# Patient Record
Sex: Male | Born: 1960
Health system: Southern US, Community
[De-identification: ages and names within clinical notes are randomized; demographics above are authoritative.]

## PROBLEM LIST (undated history)

## (undated) DIAGNOSIS — R002 Palpitations: Secondary | ICD-10-CM

## (undated) DIAGNOSIS — C8190 Hodgkin lymphoma, unspecified, unspecified site: Secondary | ICD-10-CM

## (undated) DIAGNOSIS — E785 Hyperlipidemia, unspecified: Secondary | ICD-10-CM

## (undated) DIAGNOSIS — J309 Allergic rhinitis, unspecified: Secondary | ICD-10-CM

## (undated) DIAGNOSIS — I10 Essential (primary) hypertension: Secondary | ICD-10-CM

## (undated) DIAGNOSIS — E039 Hypothyroidism, unspecified: Secondary | ICD-10-CM

## (undated) HISTORY — DX: Palpitations: R00.2

## (undated) HISTORY — DX: Hypothyroidism, unspecified: E03.9

## (undated) HISTORY — PX: SPLENECTOMY: SUR1306

## (undated) HISTORY — DX: Hodgkin lymphoma, unspecified, unspecified site: C81.90

## (undated) HISTORY — DX: Allergic rhinitis, unspecified: J30.9

---

## 1981-03-23 DIAGNOSIS — C8190 Hodgkin lymphoma, unspecified, unspecified site: Secondary | ICD-10-CM

## 1981-03-23 HISTORY — DX: Hodgkin lymphoma, unspecified, unspecified site: C81.90

## 2002-11-19 ENCOUNTER — Encounter: Payer: Self-pay | Admitting: Emergency Medicine

## 2002-11-19 ENCOUNTER — Emergency Department (HOSPITAL_COMMUNITY): Admission: AD | Admit: 2002-11-19 | Discharge: 2002-11-19 | Payer: Self-pay | Admitting: Emergency Medicine

## 2002-11-20 ENCOUNTER — Observation Stay (HOSPITAL_COMMUNITY): Admission: EM | Admit: 2002-11-20 | Discharge: 2002-11-20 | Payer: Self-pay | Admitting: Emergency Medicine

## 2002-12-13 ENCOUNTER — Ambulatory Visit (HOSPITAL_COMMUNITY): Admission: RE | Admit: 2002-12-13 | Discharge: 2002-12-13 | Payer: Self-pay | Admitting: Internal Medicine

## 2002-12-15 ENCOUNTER — Ambulatory Visit (HOSPITAL_COMMUNITY): Admission: RE | Admit: 2002-12-15 | Discharge: 2002-12-15 | Payer: Self-pay | Admitting: Internal Medicine

## 2002-12-15 ENCOUNTER — Encounter: Payer: Self-pay | Admitting: Internal Medicine

## 2005-11-19 ENCOUNTER — Ambulatory Visit (HOSPITAL_COMMUNITY): Admission: RE | Admit: 2005-11-19 | Discharge: 2005-11-19 | Payer: Self-pay | Admitting: Internal Medicine

## 2010-04-13 ENCOUNTER — Encounter: Payer: Self-pay | Admitting: Internal Medicine

## 2010-08-13 ENCOUNTER — Other Ambulatory Visit: Payer: Self-pay | Admitting: Internal Medicine

## 2010-08-13 DIAGNOSIS — E291 Testicular hypofunction: Secondary | ICD-10-CM

## 2010-08-19 ENCOUNTER — Ambulatory Visit
Admission: RE | Admit: 2010-08-19 | Discharge: 2010-08-19 | Disposition: A | Discharge status: Home / Self Care | Payer: BC Managed Care – PPO | Source: Ambulatory Visit | Attending: Internal Medicine | Admitting: Internal Medicine

## 2010-08-19 DIAGNOSIS — E291 Testicular hypofunction: Secondary | ICD-10-CM

## 2010-08-20 ENCOUNTER — Ambulatory Visit (HOSPITAL_COMMUNITY)
Admission: RE | Admit: 2010-08-20 | Discharge: 2010-08-20 | Disposition: A | Discharge status: Home / Self Care | Payer: BC Managed Care – PPO | Source: Ambulatory Visit | Attending: Internal Medicine | Admitting: Internal Medicine

## 2010-08-20 DIAGNOSIS — I359 Nonrheumatic aortic valve disorder, unspecified: Secondary | ICD-10-CM

## 2010-08-20 DIAGNOSIS — I080 Rheumatic disorders of both mitral and aortic valves: Secondary | ICD-10-CM | POA: Insufficient documentation

## 2010-08-20 DIAGNOSIS — I379 Nonrheumatic pulmonary valve disorder, unspecified: Secondary | ICD-10-CM | POA: Insufficient documentation

## 2010-08-20 DIAGNOSIS — I10 Essential (primary) hypertension: Secondary | ICD-10-CM | POA: Insufficient documentation

## 2012-10-09 ENCOUNTER — Ambulatory Visit (HOSPITAL_BASED_OUTPATIENT_CLINIC_OR_DEPARTMENT_OTHER): Payer: BC Managed Care – PPO | Attending: Internal Medicine | Admitting: Radiology

## 2012-10-09 VITALS — Ht 75.0 in | Wt 228.0 lb

## 2012-10-09 DIAGNOSIS — G4733 Obstructive sleep apnea (adult) (pediatric): Secondary | ICD-10-CM | POA: Insufficient documentation

## 2012-10-15 DIAGNOSIS — R0609 Other forms of dyspnea: Secondary | ICD-10-CM

## 2012-10-15 DIAGNOSIS — R0989 Other specified symptoms and signs involving the circulatory and respiratory systems: Secondary | ICD-10-CM

## 2012-10-15 DIAGNOSIS — G4733 Obstructive sleep apnea (adult) (pediatric): Secondary | ICD-10-CM

## 2012-10-15 NOTE — Procedures (Signed)
NAME:  John Clay, John Clay NO.:  [id number]  MEDICAL RECORD NO.:  [id number]          PATIENT TYPE:  OUT  LOCATION:  SLEEP CENTER                 FACILITY:  [facility]  PHYSICIAN:  Asianna Brundage D. Maple Hudson, MD, FCCP, FACPDATE OF BIRTH:  [birth year] 02, 1960  DATE OF STUDY:  10/09/2012                           NOCTURNAL POLYSOMNOGRAM  REFERRING PHYSICIAN:  Margaretmary Bayley, M.D.  INDICATION FOR STUDY:  Hypersomnia with sleep apnea.  EPWORTH SLEEPINESS SCORE:  11/24.  BMI 28.5, weight 228 pounds, height 75 inches, neck 14 inches.  MEDICATIONS:  Home medications are charted for review.  SLEEP ARCHITECTURE:  Total sleep time 338.5 minutes with sleep efficiency 86.9%.  Stage I was 4.9%, stage II 74.2%.  Stage III absent. REM 21% of total sleep time.  Sleep latency 7.5 minutes.  REM latency 73 minutes.  Awake after sleep onset 42.5 minutes.  Arousal index 7.1. Bedtime medication:  None.  RESPIRATORY DATA:  Apnea-hypopnea index (AHI) 12.2 per hour.  A total of 69 events was scored including 31 obstructive apneas, 4 central apneas, 34 hypopneas.  Events were more common while supine and while in REM. REM AHI 13.5 per hour.  This is a diagnostic NPSG protocol as ordered without CPAP.  OXYGEN DATA:  Moderately loud snoring with oxygen desaturation to a nadir of 85% and mean oxygen saturation through the study of 94.5% on room air.  CARDIAC DATA:  Sinus rhythm with frequent PVCs and occasional PAC.  MOVEMENT-PARASOMNIA:  No significant movement disturbance.  Bathroom x1.  IMPRESSIONS-RECOMMENDATIONS: 1. Mild obstructive sleep apnea/hypopnea syndrome, AHI  12.2 per hour.     Events were more common while supine and while in REM.  REM AHI     13.5 per hour.  Moderately loud snoring with oxygen desaturation to     a nadir of 85% and mean oxygen saturation through the study of     94.5% on room air. 2. This study was ordered as a diagnostic NPSG protocol without CPAP.     The patient can return  for dedicated CPAP titration study if     appropriate.    [name], MD, [facility], FACP Diplomate, American Board of Sleep Medicine   CDY/MEDQ  D:  10/15/2012 13:46:34  T:  10/15/2012 14:32:04  Job:  [id number]

## 2012-10-20 ENCOUNTER — Institutional Professional Consult (permissible substitution): Payer: BC Managed Care – PPO | Admitting: Pulmonary Disease

## 2012-12-15 ENCOUNTER — Ambulatory Visit (INDEPENDENT_AMBULATORY_CARE_PROVIDER_SITE_OTHER): Payer: BC Managed Care – PPO | Admitting: Pulmonary Disease

## 2012-12-15 ENCOUNTER — Encounter: Payer: Self-pay | Admitting: Pulmonary Disease

## 2012-12-15 VITALS — BP 130/80 | HR 94 | Temp 97.9°F | Ht 76.0 in | Wt 234.0 lb

## 2012-12-15 DIAGNOSIS — G4733 Obstructive sleep apnea (adult) (pediatric): Secondary | ICD-10-CM | POA: Insufficient documentation

## 2012-12-15 NOTE — Patient Instructions (Signed)
Will arrange for CPAP set up at home Follow up in 2 months 

## 2012-12-15 NOTE — Assessment & Plan Note (Signed)
He has snoring, sleep disruption, witnessed apnea, and daytime sleepiness.  His sleep study shows mild obstructive sleep apnea.  I have reviewed the recent sleep study results with the patient.  We discussed how sleep apnea can affect various health problems including risks for hypertension, cardiovascular disease, and diabetes.  We also discussed how sleep disruption can increase risks for accident, such as while driving.  Weight loss as a means of improving sleep apnea was also reviewed.  Additional treatment options discussed were CPAP therapy, oral appliance, and surgical intervention.  Will arrange for auto CPAP set up.

## 2012-12-15 NOTE — Progress Notes (Deleted)
  Subjective:    Patient ID: John Clay, male    DOB: 1960-09-08, 52 y.o.   MRN: [id number]  HPI    Review of Systems  Constitutional: Positive for fatigue. Negative for fever, chills, diaphoresis, activity change, appetite change and unexpected weight change.  HENT: Negative for hearing loss, ear pain, nosebleeds, congestion, sore throat, facial swelling, rhinorrhea, sneezing, mouth sores, trouble swallowing, neck pain, neck stiffness, dental problem, voice change, postnasal drip, sinus pressure, tinnitus and ear discharge.   Eyes: Negative for photophobia, discharge, itching and visual disturbance.  Respiratory: Negative for apnea, cough, choking, chest tightness, shortness of breath, wheezing and stridor.   Cardiovascular: Negative for chest pain, palpitations and leg swelling.  Gastrointestinal: Negative for nausea, vomiting, abdominal pain, constipation, blood in stool and abdominal distention.  Genitourinary: Negative for dysuria, urgency, frequency, hematuria, flank pain, decreased urine volume and difficulty urinating.  Musculoskeletal: Positive for myalgias. Negative for back pain, joint swelling, arthralgias and gait problem.  Skin: Positive for rash. Negative for color change and pallor.  Neurological: Negative for dizziness, tremors, seizures, syncope, speech difficulty, weakness, light-headedness, numbness and headaches.  Hematological: Negative for adenopathy. Does not bruise/bleed easily.  Psychiatric/Behavioral: Positive for sleep disturbance. Negative for confusion and agitation. The patient is nervous/anxious.        Objective:   Physical Exam        Assessment & Plan:

## 2012-12-15 NOTE — Progress Notes (Signed)
Chief Complaint  Patient presents with  . Sleep Consult    referred by Dr. Chestine Spore for OSA. Epworth Score: 9.    History of Present Illness: John Clay is a 52 y.o. male for evaluation of sleep problems.  He has noticed trouble with feeling fatigued and sleepy during the afternoon.  His family has been concerned about his snoring, and has told him he stops breathing while asleep.  As a result he had sleep study in July 2014 which showed mild sleep apnea.  He was referred to pulmonary/sleep medicine for further assessment.  He goes to sleep between 10 and midnight.  He falls asleep quickly.  He wakes up 1 time to use the bathroom.  He gets out of bed at 630 am.  He feels okay in the morning.  He denies morning headache.  He does not use anything to help him fall sleep or stay awake.  He denies sleep walking, sleep talking, bruxism, or nightmares.  There is no history of restless legs.  He denies sleep hallucinations, sleep paralysis, or cataplexy.  The Epworth score is 9 out of 24.  Tests: PSG 10/09/12 >> AHI 12.2, SpO2 low 85%  John Clay  has a past medical history of Hypothyroidism; Heart palpitations; Hodgkin's lymphoma (1983); and Allergic rhinitis.  John Clay  has past surgical history that includes Splenectomy.  Prior to Admission medications   Medication Sig Start Date End Date Taking? Authorizing Provider  AXIRON 30 MG/ACT SOLN Apply 1 Squirt topically daily. 11/07/12   Historical Provider, MD  metoprolol succinate (TOPROL-XL) 25 MG 24 hr tablet Take 1 tablet by mouth daily. 12/07/12   Historical Provider, MD  SYNTHROID 88 MCG tablet Take 1 tablet by mouth daily. 12/01/12   Historical Provider, MD    No Known Allergies  His family history includes Hypertension in his father; Kidney disease in his father.  He  reports that he has never smoked. He has never used smokeless tobacco. He reports that  drinks alcohol. He reports that he does not use illicit  drugs.  Review of Systems  Constitutional: Positive for fatigue. Negative for fever, chills, diaphoresis, activity change, appetite change and unexpected weight change.  HENT: Negative for hearing loss, ear pain, nosebleeds, congestion, sore throat, facial swelling, rhinorrhea, sneezing, mouth sores, trouble swallowing, neck pain, neck stiffness, dental problem, voice change, postnasal drip, sinus pressure, tinnitus and ear discharge.   Eyes: Negative for photophobia, discharge, itching and visual disturbance.  Respiratory: Negative for apnea, cough, choking, chest tightness, shortness of breath, wheezing and stridor.   Cardiovascular: Negative for chest pain, palpitations and leg swelling.  Gastrointestinal: Negative for nausea, vomiting, abdominal pain, constipation, blood in stool and abdominal distention.  Genitourinary: Negative for dysuria, urgency, frequency, hematuria, flank pain, decreased urine volume and difficulty urinating.  Musculoskeletal: Positive for myalgias. Negative for back pain, joint swelling, arthralgias and gait problem.  Skin: Positive for rash. Negative for color change and pallor.  Neurological: Negative for dizziness, tremors, seizures, syncope, speech difficulty, weakness, light-headedness, numbness and headaches.  Hematological: Negative for adenopathy. Does not bruise/bleed easily.  Psychiatric/Behavioral: Positive for sleep disturbance. Negative for confusion and agitation. The patient is nervous/anxious.    Physical Exam:  General - No distress ENT - No sinus tenderness, no oral exudate, no LAN, no thyromegaly, TM clear, pupils equal/reactive, MP 3, scalloped tongue, 3+ tonsils, elongated uvula Cardiac - s1s2 regular, no murmur, pulses symmetric Chest - No wheeze/rales/dullness, good air entry, normal respiratory excursion  Back - No focal tenderness Abd - Soft, non-tender, no organomegaly, + bowel sounds Ext - No edema Neuro - Normal strength, cranial nerves  intact Skin - No rashes Psych - Normal mood, and behavior

## 2013-02-07 ENCOUNTER — Encounter: Payer: Self-pay | Admitting: Pulmonary Disease

## 2013-02-14 ENCOUNTER — Encounter (INDEPENDENT_AMBULATORY_CARE_PROVIDER_SITE_OTHER): Payer: Self-pay

## 2013-02-14 ENCOUNTER — Ambulatory Visit (INDEPENDENT_AMBULATORY_CARE_PROVIDER_SITE_OTHER): Payer: BC Managed Care – PPO | Admitting: Pulmonary Disease

## 2013-02-14 ENCOUNTER — Encounter: Payer: Self-pay | Admitting: Pulmonary Disease

## 2013-02-14 VITALS — BP 122/82 | HR 90 | Temp 97.9°F | Ht 75.0 in | Wt 236.0 lb

## 2013-02-14 DIAGNOSIS — G4733 Obstructive sleep apnea (adult) (pediatric): Secondary | ICD-10-CM

## 2013-02-14 NOTE — Patient Instructions (Signed)
Will call with results of CPAP report  Follow up in 1 year 

## 2013-02-14 NOTE — Progress Notes (Signed)
Chief Complaint  Patient presents with  . Sleep Apnea    Currently using CPAP every night for 6 hours. Feels well rested after use. Denies problems with machine, mask or pressure.    History of Present Illness: John Clay is a 52 y.o. male with mild OSA using auto CPAP.  He has been doing well with CPAP.  He is sleeping better, and feels more rested.  He has full face mask, and no issues with mask or pressure settings.  TESTS: PSG 10/09/12 >> AHI 12.2, SpO2 low 85%   John Clay  has a past medical history of Hypothyroidism; Heart palpitations; Hodgkin's lymphoma (1983); and Allergic rhinitis.  John Clay  has past surgical history that includes Splenectomy.  Prior to Admission medications   Medication Sig Start Date End Date Taking? Authorizing Provider  AXIRON 30 MG/ACT SOLN Apply 1 Squirt topically daily. 11/07/12  Yes Historical Provider, MD  metoprolol succinate (TOPROL-XL) 25 MG 24 hr tablet Take 1 tablet by mouth daily. 12/07/12  Yes Historical Provider, MD  SYNTHROID 88 MCG tablet Take 1 tablet by mouth daily. 12/01/12  Yes Historical Provider, MD    No Known Allergies   Physical Exam:  General - No distress ENT - No sinus tenderness, no oral exudate, no LAN, MP 3, scalloped tongue, 3+ tonsils, elongated uvula Cardiac - s1s2 regular, no murmur Chest - No wheeze/rales/dullness Back - No focal tenderness Abd - Soft, non-tender Ext - No edema Neuro - Normal strength Skin - No rashes Psych - normal mood, and behavior   Assessment/Plan:  Coralyn Helling, MD [facility] Pager:  [phone]

## 2013-02-14 NOTE — Assessment & Plan Note (Signed)
He reports compliance with therapy, and improvement in symptoms with CPAP use.  Will call him with results of his CPAP download.

## 2013-02-15 ENCOUNTER — Telehealth: Payer: Self-pay | Admitting: Pulmonary Disease

## 2013-02-15 ENCOUNTER — Encounter: Payer: Self-pay | Admitting: Pulmonary Disease

## 2013-02-15 NOTE — Telephone Encounter (Signed)
Pt is aware of results. 

## 2013-02-15 NOTE — Telephone Encounter (Signed)
CPAP 01/07/13 to 01/26/13 >> Used on 21 of 30 nights with average 5 hrs 59 min.  Average AHI 1.6 with median CPAP 7.4 cm H2O and 95 th percentile CPAP 10.2 cm H2O.  Will have my nurse inform pt that CPAP report looks very good.  No change to current set up needed.

## 2014-08-13 LAB — HM COLONOSCOPY

## 2014-08-30 ENCOUNTER — Other Ambulatory Visit: Payer: Self-pay | Admitting: Gastroenterology

## 2014-08-30 DIAGNOSIS — R1084 Generalized abdominal pain: Secondary | ICD-10-CM

## 2014-09-04 ENCOUNTER — Ambulatory Visit
Admission: RE | Admit: 2014-09-04 | Discharge: 2014-09-04 | Disposition: A | Discharge status: Home / Self Care | Payer: BLUE CROSS/BLUE SHIELD | Source: Ambulatory Visit | Attending: Gastroenterology | Admitting: Gastroenterology

## 2014-09-04 DIAGNOSIS — R1084 Generalized abdominal pain: Secondary | ICD-10-CM

## 2014-09-04 MED ORDER — IOPAMIDOL (ISOVUE-300) INJECTION 61%
125.0000 mL | Freq: Once | INTRAVENOUS | Status: AC | PRN
  Administered 2014-09-04: 125 mL via INTRAVENOUS

## 2016-02-19 DIAGNOSIS — R0789 Other chest pain: Secondary | ICD-10-CM | POA: Insufficient documentation

## 2016-02-19 DIAGNOSIS — Z8571 Personal history of Hodgkin lymphoma: Secondary | ICD-10-CM | POA: Insufficient documentation

## 2016-02-19 DIAGNOSIS — I10 Essential (primary) hypertension: Secondary | ICD-10-CM | POA: Insufficient documentation

## 2016-02-20 ENCOUNTER — Ambulatory Visit (INDEPENDENT_AMBULATORY_CARE_PROVIDER_SITE_OTHER): Payer: BLUE CROSS/BLUE SHIELD | Admitting: Interventional Cardiology

## 2016-02-20 ENCOUNTER — Encounter: Payer: Self-pay | Admitting: Interventional Cardiology

## 2016-02-20 VITALS — BP 146/94 | HR 82 | Ht 75.0 in | Wt 231.8 lb

## 2016-02-20 DIAGNOSIS — G4733 Obstructive sleep apnea (adult) (pediatric): Secondary | ICD-10-CM | POA: Diagnosis not present

## 2016-02-20 DIAGNOSIS — C8110 Nodular sclerosis classical Hodgkin lymphoma, unspecified site: Secondary | ICD-10-CM | POA: Diagnosis not present

## 2016-02-20 DIAGNOSIS — R0789 Other chest pain: Secondary | ICD-10-CM

## 2016-02-20 NOTE — Patient Instructions (Addendum)
Medication Instructions:  1) START Aspirin 81mg  once daily  Labwork: Your physician recommends that you return for lab work on the same day as your stress test (Lipid).   Testing/Procedures: Your physician has requested that you have en exercise stress myoview. For further information please visit [email or web address]. Please follow instruction sheet, as given.   Follow-Up: Your physician recommends that you schedule a follow-up appointment as needed with Dr. Tamala Julian.   Any Other Special Instructions Will Be Listed Below (If Applicable).     If you need a refill on your cardiac medications before your next appointment, please call your pharmacy.

## 2016-02-20 NOTE — Progress Notes (Signed)
Cardiology Office Note    Date:  02/20/2016   ID:  WELLINGTON AMLIN, DOB 1960/09/18, MRN [id number]  PCP:  Foye Spurling, MD  Cardiologist: Sinclair Grooms, MD   Chief Complaint  Patient presents with  . 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  He presents today with a history of chest pressure lasting 48-72 hours, 2 months ago. The discomfort eventually resolved and he has felt fine since that time. He exercises twice a week without limitations. There is no prior history of heart disease. No significant family history of hematuria atherosclerosis. He does not smoke. He is not diabetic. There is no history of hypertension.    Past Medical History:  Diagnosis Date  . Allergic rhinitis   . Heart palpitations   . Hodgkin's lymphoma ([place]) 1983   Treated with radiation therapy  . Hypothyroidism    Secondary to radiation therapy    Past Surgical History:  Procedure Laterality Date  . SPLENECTOMY      Current Medications: Outpatient Medications Prior to Visit  Medication Sig Dispense Refill  . AXIRON 30 MG/ACT SOLN Apply 1 Squirt topically daily.    . metoprolol succinate (TOPROL-XL) 25 MG 24 hr tablet Take 1 tablet by mouth daily.    Marland Kitchen SYNTHROID 88 MCG tablet Take 1 tablet by mouth daily.     No facility-administered medications prior to visit.      Allergies:   Other   Social History   Social History  . Marital status: Married    Spouse name: N/A  . Number of children: N/A  . Years of education: N/A   Social History Main Topics  . Smoking status: Never Smoker  . Smokeless tobacco: Never Used  . Alcohol use Yes  . Drug use: No  . Sexual activity: Not Asked   Other Topics Concern  . None   Social History Narrative  . None     Family History:  The patient's family history includes Healthy  in his mother; Hypertension in his father; Kidney disease in his father.   ROS:   Please see the history of present illness.    Muscle pain, occasional difficulty sleeping.  All other systems reviewed and are negative.   PHYSICAL EXAM:   VS:  BP (!) 146/94 (BP Location: Right Arm)   Pulse 82   Ht 6\' 3"  (1.905 m)   Wt 231 lb 12.8 oz (105.1 kg)   BMI 28.97 kg/m    GEN: Well nourished, well developed, in no acute distress  HEENT: normal  Neck: no JVD, carotid bruits, or masses Cardiac: RRR; 2/6 apical systolic murmurs. No rubs. There is an S4 gallops,no edema  Respiratory:  clear to auscultation bilaterally, normal work of breathing GI: soft, nontender, nondistended, + BS MS: no deformity or atrophy  Skin: warm and dry, no rash Neuro:  Alert and Oriented x 3, Strength and sensation are intact Psych: euthymic mood, full affect  Wt Readings from Last 3 Encounters:  02/20/16 231 lb 12.8 oz (105.1 kg)  02/14/13 236 lb (107 kg)  12/15/12 234 lb (106.1 kg)      Studies/Labs Reviewed:   EKG:  EKG  Normal sinus rhythm with diffuse ST elevation. No old tracing to compare. Findings are compatible with early repolarization versus pericarditis.  Recent Labs: No results found  for requested labs within last 8760 hours.   Lipid Panel No results found for: CHOL, TRIG, HDL, CHOLHDL, VLDL, LDLCALC, LDLDIRECT  Additional studies/ records that were reviewed today include:  Prior echocardiogram in 2012 demonstrated normal LV function without significant valvular abnormality although there was mild aortic and mitral regurgitation. Mild diastolic dysfunction was also noted. No LVH was present.    ASSESSMENT:    1. Chest discomfort   2. Nodular sclerosing Hodgkin's lymphoma, unspecified body region ([place])   3. OSA (obstructive sleep apnea)      PLAN:  In order of problems listed above:  1. 48-72 hour episode of chest pressure occurring 2 months ago. No discomfort since that time. Prior  history of radiation as part of therapy for Hodgkin's lymphoma. Plan stress myocardial perfusion study to exclude myocardial ischemia in the setting of prior cardiac radiation related to lymphoma. Start aspirin 81 mg per day. We will check a lipid panel and pain for target LDL cholesterols of 70 or less. 2. Asymptomatic/cured 3. Uses C Pap.    Medication Adjustments/Labs and Tests Ordered: Current medicines are reviewed at length with the patient today.  Concerns regarding medicines are outlined above.  Medication changes, Labs and Tests ordered today are listed in the Patient Instructions below. Patient Instructions  Medication Instructions:  1) START Aspirin 81mg  once daily  Labwork: Your physician recommends that you return for lab work on the same day as your stress test (Lipid).   Testing/Procedures: Your physician has requested that you have en exercise stress myoview. For further information please visit [email or web address]. Please follow instruction sheet, as given.   Follow-Up: Your physician recommends that you schedule a follow-up appointment as needed with Dr. Tamala Julian.   Any Other Special Instructions Will Be Listed Below (If Applicable).     If you need a refill on your cardiac medications before your next appointment, please call your pharmacy.      Signed, Sinclair Grooms, MD  02/20/2016 9:33 AM    [facility] [address], [place], [place]  [address] Phone: [phone]; Fax: [phone]

## 2016-02-26 ENCOUNTER — Encounter: Payer: Self-pay | Admitting: Interventional Cardiology

## 2016-03-17 ENCOUNTER — Telehealth (HOSPITAL_COMMUNITY): Payer: Self-pay | Admitting: *Deleted

## 2016-03-17 NOTE — Telephone Encounter (Signed)
Left message on voicemail in reference to upcoming appointment scheduled for 03/20/16. Phone number given for a call back so details instructions can be given. John Clay W   

## 2016-03-20 ENCOUNTER — Ambulatory Visit (HOSPITAL_COMMUNITY): Payer: BLUE CROSS/BLUE SHIELD | Attending: Cardiovascular Disease

## 2016-03-20 ENCOUNTER — Other Ambulatory Visit: Payer: BLUE CROSS/BLUE SHIELD | Admitting: *Deleted

## 2016-03-20 DIAGNOSIS — R0789 Other chest pain: Secondary | ICD-10-CM | POA: Insufficient documentation

## 2016-03-20 DIAGNOSIS — R9439 Abnormal result of other cardiovascular function study: Secondary | ICD-10-CM | POA: Diagnosis not present

## 2016-03-20 LAB — LIPID PANEL
CHOL/HDL RATIO: 2.9 ratio (ref <5.0)
Cholesterol: 158 mg/dL (ref <200)
HDL: 55 mg/dL (ref >40)
LDL CALC: 91 mg/dL (ref <100)
Triglycerides: 61 mg/dL (ref <150)
VLDL: 12 mg/dL (ref <30)

## 2016-03-20 LAB — MYOCARDIAL PERFUSION IMAGING
CHL CUP NUCLEAR SSS: 4
CHL CUP RESTING HR STRESS: 82 {beats}/min
CSEPED: 10 min
CSEPEDS: 0 s
CSEPEW: 11.7 METS
CSEPHR: 91 %
LV sys vol: 64 mL
LVDIAVOL: 136 mL (ref 62–150)
MPHR: 164 {beats}/min
Peak HR: 150 {beats}/min
RATE: 0.3
SDS: 1
SRS: 3
TID: 1.14

## 2016-03-20 MED ORDER — TECHNETIUM TC 99M TETROFOSMIN IV KIT
31.7000 | PACK | Freq: Once | INTRAVENOUS | Status: AC | PRN
  Administered 2016-03-20: 31.7 via INTRAVENOUS
  Filled 2016-03-20: qty 32

## 2016-03-20 MED ORDER — TECHNETIUM TC 99M TETROFOSMIN IV KIT
10.4000 | PACK | Freq: Once | INTRAVENOUS | Status: AC | PRN
  Administered 2016-03-20: 10.4 via INTRAVENOUS
  Filled 2016-03-20: qty 11

## 2016-06-22 LAB — HEMOGLOBIN A1C: HEMOGLOBIN A1C: 5.9

## 2017-04-23 ENCOUNTER — Telehealth: Payer: Self-pay | Admitting: Family Medicine

## 2017-04-23 NOTE — Telephone Encounter (Signed)
Records received for new pt. Will place on chart for pt appt on Monday.

## 2017-04-26 ENCOUNTER — Ambulatory Visit: Payer: BLUE CROSS/BLUE SHIELD | Admitting: Family Medicine

## 2017-04-26 ENCOUNTER — Encounter: Payer: Self-pay | Admitting: Family Medicine

## 2017-04-26 VITALS — BP 142/80 | HR 88 | Ht 74.0 in | Wt 229.0 lb

## 2017-04-26 DIAGNOSIS — I341 Nonrheumatic mitral (valve) prolapse: Secondary | ICD-10-CM

## 2017-04-26 DIAGNOSIS — G4733 Obstructive sleep apnea (adult) (pediatric): Secondary | ICD-10-CM | POA: Diagnosis not present

## 2017-04-26 DIAGNOSIS — J301 Allergic rhinitis due to pollen: Secondary | ICD-10-CM

## 2017-04-26 DIAGNOSIS — E559 Vitamin D deficiency, unspecified: Secondary | ICD-10-CM

## 2017-04-26 DIAGNOSIS — E039 Hypothyroidism, unspecified: Secondary | ICD-10-CM | POA: Diagnosis not present

## 2017-04-26 DIAGNOSIS — Z1159 Encounter for screening for other viral diseases: Secondary | ICD-10-CM

## 2017-04-26 DIAGNOSIS — Z8571 Personal history of Hodgkin lymphoma: Secondary | ICD-10-CM | POA: Diagnosis not present

## 2017-04-26 DIAGNOSIS — I10 Essential (primary) hypertension: Secondary | ICD-10-CM | POA: Diagnosis not present

## 2017-04-26 DIAGNOSIS — E291 Testicular hypofunction: Secondary | ICD-10-CM | POA: Diagnosis not present

## 2017-04-26 NOTE — Progress Notes (Signed)
   Subjective:    Patient ID: John Clay, male    DOB: Sep 16, 1960, 57 y.o.   MRN: [id number]  HPI He is here for get acquainted visit.  His previous physician recently retired.  He was getting excellent care there.  He has a previous history of Hodgkin's lymphoma diagnosed in 1984.  He also has OSA and presently is on CPAP.  Has a previous history of MVP but presently is having no difficulty with that.  He has seen Dr. Daneen Schick in the past for this.  He is also on testosterone replacement.  Last blood work was in April.  Does have seasonal allergies and does use Flonase.  He has a vitamin D insufficiency and presently is apparently on 50,000 units weekly. He has no particular concerns or complaints.  He did have a colonoscopy in 2017.  Review of Systems     Objective:   Physical Exam Alert and in no distress otherwise not examined       Assessment & Plan:  Essential hypertension - Plan: CBC with Differential/Platelet, Comprehensive metabolic panel  History of Hodgkin's lymphoma  OSA (obstructive sleep apnea)  MVP (mitral valve prolapse) - Plan: CBC with Differential/Platelet, Comprehensive metabolic panel, Lipid panel  Hypogonadism in male - Plan: PSA, Testosterone  Acquired hypothyroidism - Plan: TSH  Seasonal allergic rhinitis due to pollen  Vitamin D insufficiency - Plan: VITAMIN D 25 Hydroxy (Vit-D Deficiency, Fractures)  Need for hepatitis C screening test - Plan: Hepatitis C antibody  I will do routine blood screening on him and then have him come back in roughly 2 months for blood pressure recheck.

## 2017-04-27 LAB — COMPREHENSIVE METABOLIC PANEL
ALBUMIN: 4.7 g/dL (ref 3.5–5.5)
ALK PHOS: 75 IU/L (ref 39–117)
ALT: 31 IU/L (ref 0–44)
AST: 28 IU/L (ref 0–40)
Albumin/Globulin Ratio: 1.7 (ref 1.2–2.2)
BUN / CREAT RATIO: 16 (ref 9–20)
BUN: 17 mg/dL (ref 6–24)
Bilirubin Total: 0.3 mg/dL (ref 0.0–1.2)
CO2: 22 mmol/L (ref 20–29)
CREATININE: 1.09 mg/dL (ref 0.76–1.27)
Calcium: 9.4 mg/dL (ref 8.7–10.2)
Chloride: 104 mmol/L (ref 96–106)
GFR calc Af Amer: 87 mL/min/{1.73_m2} (ref >59)
GFR, EST NON AFRICAN AMERICAN: 75 mL/min/{1.73_m2} (ref >59)
Globulin, Total: 2.7 g/dL (ref 1.5–4.5)
Glucose: 92 mg/dL (ref 65–99)
Potassium: 5.1 mmol/L (ref 3.5–5.2)
SODIUM: 143 mmol/L (ref 134–144)
Total Protein: 7.4 g/dL (ref 6.0–8.5)

## 2017-04-27 LAB — CBC WITH DIFFERENTIAL/PLATELET
BASOS: 1 %
Basophils Absolute: 0 10*3/uL (ref 0.0–0.2)
EOS (ABSOLUTE): 0.1 10*3/uL (ref 0.0–0.4)
EOS: 1 %
HEMATOCRIT: 41.6 % (ref 37.5–51.0)
HEMOGLOBIN: 13.4 g/dL (ref 13.0–17.7)
Immature Grans (Abs): 0 10*3/uL (ref 0.0–0.1)
Immature Granulocytes: 0 %
LYMPHS ABS: 1.5 10*3/uL (ref 0.7–3.1)
Lymphs: 27 %
MCH: 24.7 pg — ABNORMAL LOW (ref 26.6–33.0)
MCHC: 32.2 g/dL (ref 31.5–35.7)
MCV: 77 fL — AB (ref 79–97)
Monocytes Absolute: 0.4 10*3/uL (ref 0.1–0.9)
Monocytes: 8 %
Neutrophils Absolute: 3.7 10*3/uL (ref 1.4–7.0)
Neutrophils: 63 %
Platelets: 243 10*3/uL (ref 150–379)
RBC: 5.42 x10E6/uL (ref 4.14–5.80)
RDW: 16 % — ABNORMAL HIGH (ref 12.3–15.4)
WBC: 5.7 10*3/uL (ref 3.4–10.8)

## 2017-04-27 LAB — LIPID PANEL
Chol/HDL Ratio: 3.1 ratio (ref 0.0–5.0)
Cholesterol, Total: 148 mg/dL (ref 100–199)
HDL: 48 mg/dL (ref >39)
LDL CALC: 90 mg/dL (ref 0–99)
Triglycerides: 50 mg/dL (ref 0–149)
VLDL CHOLESTEROL CAL: 10 mg/dL (ref 5–40)

## 2017-04-27 LAB — TESTOSTERONE: TESTOSTERONE: 221 ng/dL — AB (ref 264–916)

## 2017-04-27 LAB — PSA: PROSTATE SPECIFIC AG, SERUM: 0.5 ng/mL (ref 0.0–4.0)

## 2017-04-27 LAB — VITAMIN D 25 HYDROXY (VIT D DEFICIENCY, FRACTURES): VIT D 25 HYDROXY: 39.6 ng/mL (ref 30.0–100.0)

## 2017-04-27 LAB — TSH: TSH: 0.877 u[IU]/mL (ref 0.450–4.500)

## 2017-04-27 LAB — HEPATITIS C ANTIBODY

## 2017-04-27 NOTE — Addendum Note (Signed)
Addended by: Denita Lung on: 04/27/2017 08:11 AM   Modules accepted: Orders

## 2017-04-28 MED ORDER — VITAMIN D (ERGOCALCIFEROL) 1.25 MG (50000 UNIT) PO CAPS: 50000.0000 [IU] | ORAL_CAPSULE | ORAL | 3 refills | Status: DC

## 2017-04-28 MED ORDER — LEVOTHYROXINE SODIUM 88 MCG PO TABS: 88.0000 ug | ORAL_TABLET | Freq: Every day | ORAL | 3 refills | Status: DC

## 2017-04-28 MED ORDER — METOPROLOL SUCCINATE ER 25 MG PO TB24: 25.0000 mg | ORAL_TABLET | Freq: Every day | ORAL | 3 refills | Status: DC

## 2017-04-28 MED ORDER — TESTOSTERONE 30 MG/ACT TD SOLN: TRANSDERMAL | 5 refills | Status: DC

## 2017-04-28 NOTE — Addendum Note (Signed)
Addended by: Denita Lung on: 04/28/2017 09:34 AM   Modules accepted: Orders

## 2017-05-03 ENCOUNTER — Encounter: Payer: Self-pay | Admitting: Family Medicine

## 2017-05-05 ENCOUNTER — Encounter: Payer: Self-pay | Admitting: Family Medicine

## 2017-05-06 ENCOUNTER — Encounter: Payer: Self-pay | Admitting: Family Medicine

## 2017-06-28 ENCOUNTER — Encounter: Payer: Self-pay | Admitting: Family Medicine

## 2017-06-28 ENCOUNTER — Ambulatory Visit: Payer: BLUE CROSS/BLUE SHIELD | Admitting: Family Medicine

## 2017-06-28 VITALS — BP 138/82 | HR 77 | Temp 97.5°F | Ht 75.0 in | Wt 240.0 lb

## 2017-06-28 DIAGNOSIS — J301 Allergic rhinitis due to pollen: Secondary | ICD-10-CM

## 2017-06-28 DIAGNOSIS — Z8639 Personal history of other endocrine, nutritional and metabolic disease: Secondary | ICD-10-CM

## 2017-06-28 DIAGNOSIS — Z23 Encounter for immunization: Secondary | ICD-10-CM

## 2017-06-28 DIAGNOSIS — I10 Essential (primary) hypertension: Secondary | ICD-10-CM

## 2017-06-28 DIAGNOSIS — Z125 Encounter for screening for malignant neoplasm of prostate: Secondary | ICD-10-CM | POA: Diagnosis not present

## 2017-06-28 DIAGNOSIS — E291 Testicular hypofunction: Secondary | ICD-10-CM | POA: Diagnosis not present

## 2017-06-28 DIAGNOSIS — G4733 Obstructive sleep apnea (adult) (pediatric): Secondary | ICD-10-CM | POA: Diagnosis not present

## 2017-06-28 DIAGNOSIS — E039 Hypothyroidism, unspecified: Secondary | ICD-10-CM

## 2017-06-28 DIAGNOSIS — I341 Nonrheumatic mitral (valve) prolapse: Secondary | ICD-10-CM | POA: Diagnosis not present

## 2017-06-28 DIAGNOSIS — Z8571 Personal history of Hodgkin lymphoma: Secondary | ICD-10-CM | POA: Diagnosis not present

## 2017-06-28 MED ORDER — METOPROLOL SUCCINATE ER 50 MG PO TB24: 50.0000 mg | ORAL_TABLET | Freq: Every day | ORAL | 3 refills | Status: DC

## 2017-06-28 NOTE — Progress Notes (Addendum)
   Subjective:    Patient ID: John Clay, male    DOB: [birth year], 57 y.o.   MRN: [id number]  HPI He is here for an interval evaluation.  He continues on his thyroid medication is having no difficulty with that.  He is also taking metoprolol for his blood pressure.  Continues to do well on testosterone.  Does have a previous history of vitamin D deficiency.  His allergies seem to be under fairly good control.  He does have a remote history of Hodgkin's which I think because his thyroid condition.  Review of the record indicates need for another pneumonia shot.  He is followed by Dr. Tamala Julian for his mitral valve prolapse.  He does have OSA and presently does need to get some new supplies.  His allergies seem to be under good control.   Review of Systems     Objective:   Physical Exam Alert and in no distress. Tympanic membranes and canals are normal. Pharyngeal area is normal. Neck is supple without adenopathy or thyromegaly. Cardiac exam shows a regular sinus rhythm without murmurs or gallops. Lungs are clear to auscultation.        Assessment & Plan:  Acquired hypothyroidism - Plan: TSH  Essential hypertension - Plan: CBC with Differential/Platelet, Comprehensive metabolic panel, metoprolol succinate (TOPROL-XL) 50 MG 24 hr tablet  History of Hodgkin's lymphoma - 1984 with slpenectomy - Plan: CBC with Differential/Platelet, Comprehensive metabolic panel  Hypogonadism in male - Plan: Testosterone  MVP (mitral valve prolapse)  OSA (obstructive sleep apnea) - Plan: CBC with Differential/Platelet, Comprehensive metabolic panel, Lipid panel  Seasonal allergic rhinitis due to pollen  Need for vaccination against Streptococcus pneumoniae - Plan: Pneumococcal conjugate vaccine 13-valent  Screening for prostate cancer - Plan: PSA  History of vitamin D deficiency - Plan: Comprehensive metabolic panel, VITAMIN D 25 Hydroxy (Vit-D Deficiency, Fractures) I will increase his metoprolol.   Routine blood screening was done.  Pneumonia shot given.  He will continue to be followed by cardiology.  He is to get new CPAP supplies, uses for about a month and get a read out.  Continue on his present allergy meds.  I discussed diet and exercise with him including cutting back on carbohydrates.  Discussed getting his waist size down to 36 and encouraged him to be more physically active.  He does exercise twice per week with a trainer.

## 2017-06-28 NOTE — Patient Instructions (Addendum)
20 minutes a day of something physical or 150 minutes a week. An easy way to remember carbohydrates is to cut back on white food

## 2017-06-29 ENCOUNTER — Telehealth: Payer: Self-pay | Admitting: Family Medicine

## 2017-06-29 LAB — COMPREHENSIVE METABOLIC PANEL
A/G RATIO: 1.6 (ref 1.2–2.2)
ALBUMIN: 4.4 g/dL (ref 3.5–5.5)
ALT: 30 IU/L (ref 0–44)
AST: 36 IU/L (ref 0–40)
Alkaline Phosphatase: 73 IU/L (ref 39–117)
BILIRUBIN TOTAL: 0.4 mg/dL (ref 0.0–1.2)
BUN / CREAT RATIO: 11 (ref 9–20)
BUN: 13 mg/dL (ref 6–24)
CALCIUM: 9.4 mg/dL (ref 8.7–10.2)
CHLORIDE: 100 mmol/L (ref 96–106)
CO2: 26 mmol/L (ref 20–29)
Creatinine, Ser: 1.16 mg/dL (ref 0.76–1.27)
GFR calc Af Amer: 80 mL/min/{1.73_m2} (ref >59)
GFR, EST NON AFRICAN AMERICAN: 70 mL/min/{1.73_m2} (ref >59)
GLOBULIN, TOTAL: 2.7 g/dL (ref 1.5–4.5)
Glucose: 91 mg/dL (ref 65–99)
POTASSIUM: 4.7 mmol/L (ref 3.5–5.2)
Sodium: 140 mmol/L (ref 134–144)
Total Protein: 7.1 g/dL (ref 6.0–8.5)

## 2017-06-29 LAB — CBC WITH DIFFERENTIAL/PLATELET
Basophils Absolute: 0 10*3/uL (ref 0.0–0.2)
Basos: 1 %
EOS (ABSOLUTE): 0.1 10*3/uL (ref 0.0–0.4)
EOS: 1 %
HEMATOCRIT: 45.9 % (ref 37.5–51.0)
HEMOGLOBIN: 14.4 g/dL (ref 13.0–17.7)
Immature Grans (Abs): 0 10*3/uL (ref 0.0–0.1)
Immature Granulocytes: 0 %
LYMPHS ABS: 1.5 10*3/uL (ref 0.7–3.1)
Lymphs: 26 %
MCH: 25.1 pg — AB (ref 26.6–33.0)
MCHC: 31.4 g/dL — AB (ref 31.5–35.7)
MCV: 80 fL (ref 79–97)
MONOCYTES: 9 %
Monocytes Absolute: 0.5 10*3/uL (ref 0.1–0.9)
NEUTROS ABS: 3.7 10*3/uL (ref 1.4–7.0)
Neutrophils: 63 %
Platelets: 211 10*3/uL (ref 150–379)
RBC: 5.73 x10E6/uL (ref 4.14–5.80)
RDW: 16.5 % — ABNORMAL HIGH (ref 12.3–15.4)
WBC: 5.8 10*3/uL (ref 3.4–10.8)

## 2017-06-29 LAB — LIPID PANEL
CHOL/HDL RATIO: 2.7 ratio (ref 0.0–5.0)
Cholesterol, Total: 158 mg/dL (ref 100–199)
HDL: 58 mg/dL (ref >39)
LDL Calculated: 91 mg/dL (ref 0–99)
Triglycerides: 46 mg/dL (ref 0–149)
VLDL Cholesterol Cal: 9 mg/dL (ref 5–40)

## 2017-06-29 LAB — PSA: PROSTATE SPECIFIC AG, SERUM: 0.8 ng/mL (ref 0.0–4.0)

## 2017-06-29 LAB — VITAMIN D 25 HYDROXY (VIT D DEFICIENCY, FRACTURES): VIT D 25 HYDROXY: 47.8 ng/mL (ref 30.0–100.0)

## 2017-06-29 LAB — TSH: TSH: 0.662 u[IU]/mL (ref 0.450–4.500)

## 2017-06-29 LAB — TESTOSTERONE: TESTOSTERONE: 419 ng/dL (ref 264–916)

## 2017-06-29 NOTE — Telephone Encounter (Signed)
P.A. Testosterone Completed and approved, tried to reach pt by phone, mail box full.  Called pharmacy & went thru for $10

## 2017-06-29 NOTE — Telephone Encounter (Signed)
Pt left message that Testosterone requires P.A.

## 2017-08-09 ENCOUNTER — Ambulatory Visit: Payer: BLUE CROSS/BLUE SHIELD | Admitting: Family Medicine

## 2017-09-18 ENCOUNTER — Other Ambulatory Visit: Payer: Self-pay | Admitting: Family Medicine

## 2017-09-18 DIAGNOSIS — I10 Essential (primary) hypertension: Secondary | ICD-10-CM

## 2017-09-20 NOTE — Telephone Encounter (Signed)
[facility] please advsie If alternative for metoprolol is ok for pt to take . It will be cheaper or pt. [place]

## 2017-09-29 ENCOUNTER — Other Ambulatory Visit: Payer: Self-pay

## 2017-09-29 ENCOUNTER — Telehealth: Payer: Self-pay

## 2017-09-29 DIAGNOSIS — I10 Essential (primary) hypertension: Secondary | ICD-10-CM

## 2017-09-29 MED ORDER — METOPROLOL TARTRATE 25 MG PO TABS: 25.0000 mg | ORAL_TABLET | Freq: Once | ORAL | 3 refills | Status: DC

## 2017-09-29 NOTE — Telephone Encounter (Signed)
Pharmacy called stating that the directions for Metoprolol needs clarification. Please contact pharmacy

## 2017-09-29 NOTE — Telephone Encounter (Signed)
Med sent in due to pharmacy call. [place]

## 2017-09-30 NOTE — Telephone Encounter (Signed)
Done KH 

## 2017-11-15 ENCOUNTER — Other Ambulatory Visit: Payer: Self-pay | Admitting: Family Medicine

## 2017-11-15 DIAGNOSIS — E291 Testicular hypofunction: Secondary | ICD-10-CM

## 2017-11-15 NOTE — Telephone Encounter (Signed)
[facility] is requesting to fill pt testosterone. Please advise KH °

## 2017-12-07 DIAGNOSIS — Z23 Encounter for immunization: Secondary | ICD-10-CM | POA: Diagnosis not present

## 2017-12-14 ENCOUNTER — Other Ambulatory Visit: Payer: Self-pay | Admitting: Family Medicine

## 2017-12-14 DIAGNOSIS — E291 Testicular hypofunction: Secondary | ICD-10-CM

## 2017-12-14 NOTE — Telephone Encounter (Signed)
[facility] is requesting to fill pt testosterone. Please advise KH °

## 2017-12-15 NOTE — Telephone Encounter (Signed)
He needs a follow-up appointment

## 2017-12-16 NOTE — Telephone Encounter (Signed)
Done KH 

## 2017-12-23 ENCOUNTER — Other Ambulatory Visit: Payer: Self-pay | Admitting: Family Medicine

## 2017-12-23 DIAGNOSIS — E039 Hypothyroidism, unspecified: Secondary | ICD-10-CM

## 2017-12-24 ENCOUNTER — Encounter: Payer: Self-pay | Admitting: Family Medicine

## 2017-12-24 ENCOUNTER — Ambulatory Visit: Payer: BLUE CROSS/BLUE SHIELD | Admitting: Family Medicine

## 2017-12-24 VITALS — BP 118/80 | HR 73 | Temp 97.8°F | Wt 230.8 lb

## 2017-12-24 DIAGNOSIS — E291 Testicular hypofunction: Secondary | ICD-10-CM | POA: Diagnosis not present

## 2017-12-24 DIAGNOSIS — Z23 Encounter for immunization: Secondary | ICD-10-CM | POA: Diagnosis not present

## 2017-12-24 DIAGNOSIS — E039 Hypothyroidism, unspecified: Secondary | ICD-10-CM

## 2017-12-24 NOTE — Progress Notes (Signed)
   Subjective:    Patient ID: John Clay, male    DOB: 1960-06-24, 57 y.o.   MRN: [id number]  HPI He is here for medication management check.  He has noted intermittent fatigue.  He is presently on testosterone as well as thyroid medication.  Does take extra vitamin D and multivitamins.  He does drink socially and does not smoke.  He does use Allegra-D for his allergies.  Did have a colonoscopy approximately 2 years ago.  Also has concerns over some pigment changes on his left anterior chest.   Review of Systems     Objective:   Physical Exam Alert and in no distress. Tympanic membranes and canals are normal. Pharyngeal area is normal. Neck is supple without adenopathy or thyromegaly. Cardiac exam shows a regular sinus rhythm without murmurs or gallops. Lungs are clear to auscultation. Exam of his left anterior chest does show slight hyperpigmentation approximately 3 x 4 cm with very ill-defined borders.       Assessment & Plan:  Acquired hypothyroidism - Plan: TSH  Hypogonadism in male - Plan: Testosterone  Need for Tdap vaccination - Plan: Tdap vaccine greater than or equal to 7yo IM I explained that I do not think the intermittent fatigue was thyroid or testosterone related but certainly is reasonable to check.  His immunizations were updated. I explained that the pigment changes did not appear to be of any major significance.  He was comfortable with that.

## 2017-12-25 LAB — TSH: TSH: 2.12 u[IU]/mL (ref 0.450–4.500)

## 2017-12-25 LAB — TESTOSTERONE: Testosterone: 331 ng/dL (ref 264–916)

## 2018-01-25 ENCOUNTER — Other Ambulatory Visit: Payer: Self-pay | Admitting: Family Medicine

## 2018-01-25 DIAGNOSIS — E291 Testicular hypofunction: Secondary | ICD-10-CM

## 2018-01-25 NOTE — Telephone Encounter (Signed)
Is this ok to refill?  

## 2018-04-19 ENCOUNTER — Encounter: Payer: Self-pay | Admitting: Sports Medicine

## 2018-04-19 ENCOUNTER — Ambulatory Visit (INDEPENDENT_AMBULATORY_CARE_PROVIDER_SITE_OTHER): Payer: BLUE CROSS/BLUE SHIELD

## 2018-04-19 ENCOUNTER — Other Ambulatory Visit: Payer: Self-pay

## 2018-04-19 ENCOUNTER — Ambulatory Visit: Payer: BLUE CROSS/BLUE SHIELD | Admitting: Sports Medicine

## 2018-04-19 VITALS — BP 140/86 | HR 93

## 2018-04-19 DIAGNOSIS — M2041 Other hammer toe(s) (acquired), right foot: Secondary | ICD-10-CM | POA: Diagnosis not present

## 2018-04-19 DIAGNOSIS — M7741 Metatarsalgia, right foot: Secondary | ICD-10-CM

## 2018-04-19 DIAGNOSIS — M21619 Bunion of unspecified foot: Secondary | ICD-10-CM | POA: Diagnosis not present

## 2018-04-19 DIAGNOSIS — M2141 Flat foot [pes planus] (acquired), right foot: Secondary | ICD-10-CM | POA: Diagnosis not present

## 2018-04-19 DIAGNOSIS — M722 Plantar fascial fibromatosis: Secondary | ICD-10-CM

## 2018-04-19 DIAGNOSIS — M2142 Flat foot [pes planus] (acquired), left foot: Secondary | ICD-10-CM

## 2018-04-19 NOTE — Progress Notes (Signed)
Subjective: John Clay is a 58 y.o. male patient who presents to office for evaluation of Right foot pain at second toe. Patient complains of progressive pain especially over the last month or more after changing his orthotics states that he stopped wearing his orthotics about a week ago and the pain got better and reports he even had pain at the arch that felt better after he stopped wearing orthotics.  States that the pain seems to be specifically underneath the interphalangeal joint both the right second toe.  Patient denies any increased warmth redness swelling bruising or any signs of trauma to the second toe. Patient denies any other pedal complaints.   Admits to using orthotics in the past and reports that orthotics always generally help him as well with his knee hip or back pain.  Review of Systems  Musculoskeletal: Positive for joint pain.  All other systems reviewed and are negative.    Patient Active Problem List   Diagnosis Date Noted  . MVP (mitral valve prolapse) 04/26/2017  . Hypogonadism in male 04/26/2017  . Acquired hypothyroidism 04/26/2017  . Seasonal allergic rhinitis due to pollen 04/26/2017  . History of Hodgkin's lymphoma 02/19/2016  . Essential hypertension 02/19/2016  . OSA (obstructive sleep apnea) 12/15/2012    Current Outpatient Medications on File Prior to Visit  Medication Sig Dispense Refill  . aspirin EC 81 MG tablet Take 81 mg by mouth daily.    . fluticasone (FLONASE) 50 MCG/ACT nasal spray Place 1 spray into both nostrils daily as needed for allergies.    Marland Kitchen levothyroxine (SYNTHROID) 88 MCG tablet Take 1 tablet (88 mcg total) by mouth daily. 90 tablet 3  . metoprolol tartrate (LOPRESSOR) 25 MG tablet Take 1 tablet (25 mg total) by mouth once for 1 dose. 180 tablet 3  . Multiple Vitamin (MULTI-VITAMINS) TABS Take 1 tablet by mouth daily.    . Testosterone 30 MG/ACT SOLN USE 2 SQUIRTS DAILY 90 mL 5  . Vitamin D, Ergocalciferol, (DRISDOL) 50000  units CAPS capsule Take 1 capsule (50,000 Units total) by mouth every 7 (seven) days. 12 capsule 3   No current facility-administered medications on file prior to visit.     Allergies  Allergen Reactions  . Chlorpheniramine Other (See Comments)    Itchy eyes, sneezing, stuffiness  . Other Other (See Comments)    ALLERGEN: Chlorpheniramine-phenylpropan  REACTIONS: tchy eyes, sneezing, stuffiness    Objective:  General: Alert and oriented x3 in no acute distress  Dermatology: Very minimal hyperkeratotic lesion overlying 2-5 PIPJ dorsally bilateral right greater than left. No open lesions bilateral lower extremities, no webspace macerations, no ecchymosis bilateral, all nails x 10 are well manicured.  Vascular: Dorsalis Pedis and Posterior Tibial pedal pulses 2/4, Capillary Fill Time 3 seconds,(+) pedal hair growth bilateral, no edema bilateral lower extremities, Temperature gradient within normal limits.  Neurology: Johney Maine sensation intact via light touch bilateral.  Musculoskeletal: Bunion deformity, semi-flexible hammertoes 2-5 with Mild tenderness with palpation at PIPJ Right second toe. Ankle, Subtalar, Midtarsal, and MTPJ joint range of motion is within normal limits, there is no 1st ray hypermobility noted bilateral, pes planus foot type bilateral.  No pain with palpation along the plantar arch or plantar fascial insertion on the right or left foot at this time resolved after he stopped wearing orthotics. No pain with calf compression bilateral.  Strength within normal limits in all groups bilateral.   Gait: Unassisted, Non-antalgic.  Xrays  Right Foot    Impression: Mild joint  space narrowing at the digits with digital contracture consistent with hammertoe especially at the right second toe which is the longest toe with dorsal elevation on the lateral view, there is increased intermetatarsal angle with enlargement of the medial eminence of the first metatarsal consistent with bunion  deformity and pronation of the hallux with impingement onto the second toe, midtarsal breech with decreased calcaneal inclination pitch supportive of pes planus deformity.  Soft tissue margins within normal limits.  No other acute findings.       Assessment and Plan: Problem List Items Addressed This Visit    None    Visit Diagnoses    Hammertoe of second toe of right foot    -  Primary   Relevant Orders   DG Foot Complete Right (Completed)   Bunion       Metatarsalgia of right foot       Pes planus of both feet       Plantar fasciitis          -Complete examination performed -Xrays reviewed -Discussed treatement options for hammertoe secondary to excessive pronation of hallux and bunion deformity with condition of pes planus and previous arch pain now resolved after discontinuing wearing his old orthotics -Recommend good supportive shoes, metatarsal sleeve padding as dispensed at today's visit, over-the-counter soaks and topical pain creams and rubs as needed -Advised patient that he would benefit from a new set of custom molded insoles and may likely benefit from having a pair for athletic shoes and a pair for his dress shoes; office to contact patient after orthotic benefits have been checked to arrange appointment for casting -Patient to return to office for orthotics when called or sooner if condition worsens.  Landis Martins, DPM

## 2018-04-19 NOTE — Patient Instructions (Signed)
Hammer Toe  Hammer toe is a change in the shape (a deformity) of your toe. The deformity causes the middle joint of your toe to stay bent. This causes pain, especially when you are wearing shoes. Hammer toe starts gradually. At first, the toe can be straightened. Gradually over time, the deformity becomes stiff and permanent. Early treatments to keep the toe straight may relieve pain. As the deformity becomes stiff and permanent, surgery may be needed to straighten the toe. What are the causes? Hammer toe is caused by abnormal bending of the toe joint that is closest to your foot. It happens gradually over time. This pulls on the muscles and connections (tendons) of the toe joint, making them weak and stiff. It is often related to wearing shoes that are too short or narrow and do not let your toes straighten. What increases the risk? You may be at greater risk for hammer toe if you:  Are male.  Are older.  Wear shoes that are too small.  Wear high-heeled shoes that pinch your toes.  Are a ballet dancer.  Have a second toe that is longer than your big toe (first toe).  Injure your foot or toe.  Have arthritis.  Have a family history of hammer toe.  Have a nerve or muscle disorder. What are the signs or symptoms? The main symptoms of this condition are pain and deformity of the toe. The pain is worse when wearing shoes, walking, or running. Other symptoms may include:  Corns or calluses over the bent part of the toe or between the toes.  Redness and a burning feeling on the toe.  An open sore that forms on the top of the toe.  Not being able to straighten the toe. How is this diagnosed? This condition is diagnosed based on your symptoms and a physical exam. During the exam, your health care provider will try to straighten your toe to see how stiff the deformity is. You may also have tests, such as:  A blood test to check for rheumatoid arthritis.  An X-ray to show how  severe the deformity is. How is this treated? Treatment for this condition will depend on how stiff the deformity is. Surgery is often needed. However, sometimes a hammer toe can be straightened without surgery. Treatments that do not involve surgery include:  Taping the toe into a straightened position.  Using pads and cushions to protect the toe (orthotics).  Wearing shoes that provide enough room for the toes.  Doing toe-stretching exercises at home.  Taking an NSAID to reduce pain and swelling. If these treatments do not help or the toe cannot be straightened, surgery is the next option. The most common surgeries used to straighten a hammer toe include:  Arthroplasty. In this procedure, part of the joint is removed, and that allows the toe to straighten.  Fusion. In this procedure, cartilage between the two bones of the joint is taken out and the bones are fused together into one longer bone.  Implantation. In this procedure, part of the bone is removed and replaced with an implant to let the toe move again.  Flexor tendon transfer. In this procedure, the tendons that curl the toes down (flexor tendons) are repositioned. Follow these instructions at home:  Take over-the-counter and prescription medicines only as told by your health care provider.  Do toe straightening and stretching exercises as told by your health care provider.  Keep all follow-up visits as told by your health care   provider. This is important. How is this prevented?  Wear shoes that give your toes enough room and do not cause pain.  Do not wear high-heeled shoes. Contact a health care provider if:  Your pain gets worse.  Your toe becomes red or swollen.  You develop an open sore on your toe. This information is not intended to replace advice given to you by your health care provider. Make sure you discuss any questions you have with your health care provider. Document Released: 03/06/2000 Document  Revised: 10/05/2016 Document Reviewed: 07/03/2015 Elsevier Interactive Patient Education  2019 Elsevier Inc.  

## 2018-04-25 DIAGNOSIS — Z923 Personal history of irradiation: Secondary | ICD-10-CM | POA: Diagnosis not present

## 2018-04-25 DIAGNOSIS — C8128 Mixed cellularity classical Hodgkin lymphoma, lymph nodes of multiple sites: Secondary | ICD-10-CM | POA: Diagnosis not present

## 2018-04-25 DIAGNOSIS — C8118 Nodular sclerosis classical Hodgkin lymphoma, lymph nodes of multiple sites: Secondary | ICD-10-CM | POA: Diagnosis not present

## 2018-05-11 ENCOUNTER — Telehealth: Payer: Self-pay | Admitting: Sports Medicine

## 2018-05-11 NOTE — Telephone Encounter (Signed)
Called pt to give benefit information and mailbox is full.

## 2018-06-15 ENCOUNTER — Other Ambulatory Visit: Payer: Self-pay | Admitting: Family Medicine

## 2018-06-15 DIAGNOSIS — E039 Hypothyroidism, unspecified: Secondary | ICD-10-CM

## 2018-06-27 ENCOUNTER — Other Ambulatory Visit: Payer: Self-pay | Admitting: Family Medicine

## 2018-07-07 ENCOUNTER — Other Ambulatory Visit: Payer: Self-pay | Admitting: Family Medicine

## 2018-07-07 DIAGNOSIS — E559 Vitamin D deficiency, unspecified: Secondary | ICD-10-CM

## 2018-07-07 NOTE — Telephone Encounter (Signed)
[facility]. It has been a year since last fill. Please advise [facility]

## 2018-08-16 ENCOUNTER — Other Ambulatory Visit: Payer: Self-pay | Admitting: Family Medicine

## 2018-08-16 DIAGNOSIS — E291 Testicular hypofunction: Secondary | ICD-10-CM

## 2018-08-16 NOTE — Telephone Encounter (Signed)
Pt needs am med check appt. LVM kh

## 2018-08-16 NOTE — Telephone Encounter (Signed)
[facility] Please advise [facility]

## 2018-08-16 NOTE — Telephone Encounter (Signed)
He needs a follow-up appointment

## 2018-08-30 ENCOUNTER — Other Ambulatory Visit: Payer: Self-pay | Admitting: Family Medicine

## 2018-08-30 DIAGNOSIS — E039 Hypothyroidism, unspecified: Secondary | ICD-10-CM

## 2018-09-12 ENCOUNTER — Other Ambulatory Visit: Payer: Self-pay

## 2018-09-12 ENCOUNTER — Encounter: Payer: Self-pay | Admitting: Family Medicine

## 2018-09-12 ENCOUNTER — Ambulatory Visit: Payer: BLUE CROSS/BLUE SHIELD | Admitting: Family Medicine

## 2018-09-12 VITALS — BP 124/80 | HR 80 | Temp 98.1°F | Ht 75.0 in | Wt 233.0 lb

## 2018-09-12 DIAGNOSIS — Z125 Encounter for screening for malignant neoplasm of prostate: Secondary | ICD-10-CM

## 2018-09-12 DIAGNOSIS — E039 Hypothyroidism, unspecified: Secondary | ICD-10-CM | POA: Diagnosis not present

## 2018-09-12 DIAGNOSIS — Z0000 Encounter for general adult medical examination without abnormal findings: Secondary | ICD-10-CM

## 2018-09-12 DIAGNOSIS — Z23 Encounter for immunization: Secondary | ICD-10-CM

## 2018-09-12 DIAGNOSIS — I10 Essential (primary) hypertension: Secondary | ICD-10-CM

## 2018-09-12 DIAGNOSIS — E291 Testicular hypofunction: Secondary | ICD-10-CM

## 2018-09-12 DIAGNOSIS — J301 Allergic rhinitis due to pollen: Secondary | ICD-10-CM

## 2018-09-12 DIAGNOSIS — Z8571 Personal history of Hodgkin lymphoma: Secondary | ICD-10-CM

## 2018-09-12 DIAGNOSIS — G4733 Obstructive sleep apnea (adult) (pediatric): Secondary | ICD-10-CM

## 2018-09-12 MED ORDER — METOPROLOL SUCCINATE ER 50 MG PO TB24: ORAL_TABLET | ORAL | 3 refills | Status: DC

## 2018-09-12 MED ORDER — TESTOSTERONE 30 MG/ACT TD SOLN: TRANSDERMAL | 1 refills | Status: DC

## 2018-09-12 MED ORDER — LEVOTHYROXINE SODIUM 88 MCG PO TABS: 88.0000 ug | ORAL_TABLET | Freq: Every day | ORAL | 2 refills | Status: DC

## 2018-09-12 NOTE — Progress Notes (Signed)
Subjective:    Patient ID: John Clay, male    DOB: 1960-10-02, 58 y.o.   MRN: [id number]  HPI He is here for complete examination he continues on Synthroid and is having no difficulty with that.  He is taking 2 squirts of testosterone but does feel that he has low libido, decreased concentration as well as fatigue.  He states his strength is normal.  He does complain of some erectile issues because of that.  He does have underlying allergies and is using fluticasone.  Continues on metoprolol for his hypertension.  He does use his CPAP fairly regularly but states that sometimes he just does not feel like cleaning it.  He does have an underlying history of Hodgkin's lymphoma diagnosed in 1984 and does get regular follow-up.  Family and social history as well as health maintenance and immunizations was reviewed.   Review of Systems  All other systems reviewed and are negative.      Objective:   Physical Exam BP 124/80 (BP Location: Left Arm, Patient Position: Sitting)   Pulse 80   Temp 98.1 F (36.7 C)   Ht 6\' 3"  (1.905 m)   Wt 233 lb (105.7 kg)   SpO2 94%   BMI 29.12 kg/m   General Appearance:    Alert, cooperative, no distress, appears stated age  Head:    Normocephalic, without obvious abnormality, atraumatic  Eyes:    PERRL, conjunctiva/corneas clear, EOM's intact, fundi    benign  Ears:    Normal TM's and external ear canals  Nose:   Nares normal, mucosa normal, no drainage or sinus   tenderness  Throat:   Lips, mucosa, and tongue normal; teeth and gums normal  Neck:   Supple, no lymphadenopathy;  thyroid:  no   enlargement/tenderness/nodules; no carotid   bruit or JVD     Lungs:     Clear to auscultation bilaterally without wheezes, rales or     ronchi; respirations unlabored  Chest Wall:    No tenderness or deformity   Heart:    Regular rate and rhythm, S1 and S2 normal, no murmur, rub   or gallop  Breast Exam:    No chest wall tenderness, masses or gynecomastia   Abdomen:     Soft, non-tender, nondistended, normoactive bowel sounds,    no masses, no hepatosplenomegaly  Genitalia:  Deferred   Rectal:  deferred  Extremities:   No clubbing, cyanosis or edema  Pulses:   2+ and symmetric all extremities  Skin:   Skin color, texture, turgor normal, no rashes or lesions  Lymph nodes:   Cervical, supraclavicular, and axillary nodes normal  Neurologic:   CNII-XII intact, normal strength, sensation and gait; reflexes 2+ and symmetric throughout          Psych:   Normal mood, affect, hygiene and grooming.          Assessment & Plan:  Routine general medical examination at a health care facility - Plan: CBC with Differential/Platelet, Comprehensive metabolic panel, Lipid panel, Acquired hypothyroidism - Plan: levothyroxine (SYNTHROID) 88 MCG tablet, Essential hypertension - Plan: CBC with Differential/Platelet, Comprehensive metabolic panel, metoprolol succinate (TOPROL-XL) 50 MG 24 hr tablet,  Seasonal allergic rhinitis due to pollen - Plan: Continue on Flonase  OSA (obstructive sleep apnea) - Plan: Continue to use her CPAP and clean the machine more often.  History of Hodgkin's lymphoma - Plan: Routine follow-up  Hypogonadism in male - Plan: Testosterone, Testosterone 30 MG/ACT SOLN,   Screening  for prostate cancer - Plan: PSA,   Need for vaccination against Streptococcus pneumoniae - Plan: Pneumococcal polysaccharide vaccine 23-valent greater than or equal to 2yo subcutaneous/IM,  I will have him increase his testosterone to 3 pumps per day, get him back in 1 month to draw the blood for routine purposes and also testosterone and PSA.  He was comfortable with that.

## 2018-09-22 ENCOUNTER — Other Ambulatory Visit: Payer: Self-pay | Admitting: Family Medicine

## 2018-09-22 DIAGNOSIS — E039 Hypothyroidism, unspecified: Secondary | ICD-10-CM

## 2018-10-06 ENCOUNTER — Other Ambulatory Visit: Payer: Self-pay | Admitting: Family Medicine

## 2018-10-06 DIAGNOSIS — E291 Testicular hypofunction: Secondary | ICD-10-CM

## 2018-10-06 NOTE — Telephone Encounter (Signed)
He needs a med check appointment and blood work

## 2018-10-06 NOTE — Telephone Encounter (Signed)
[facility] is requesting to fill pt testosterone. Please advise KH °

## 2018-10-12 ENCOUNTER — Other Ambulatory Visit: Payer: Self-pay

## 2018-10-12 ENCOUNTER — Other Ambulatory Visit: Payer: BC Managed Care – PPO

## 2018-10-12 DIAGNOSIS — E291 Testicular hypofunction: Secondary | ICD-10-CM

## 2018-10-12 DIAGNOSIS — I10 Essential (primary) hypertension: Secondary | ICD-10-CM

## 2018-10-12 DIAGNOSIS — Z0000 Encounter for general adult medical examination without abnormal findings: Secondary | ICD-10-CM

## 2018-10-12 DIAGNOSIS — Z125 Encounter for screening for malignant neoplasm of prostate: Secondary | ICD-10-CM | POA: Diagnosis not present

## 2018-10-13 LAB — COMPREHENSIVE METABOLIC PANEL
ALT: 31 IU/L (ref 0–44)
AST: 33 IU/L (ref 0–40)
Albumin/Globulin Ratio: 1.8 (ref 1.2–2.2)
Albumin: 4.4 g/dL (ref 3.8–4.9)
Alkaline Phosphatase: 67 IU/L (ref 39–117)
BUN/Creatinine Ratio: 13 (ref 9–20)
BUN: 17 mg/dL (ref 6–24)
Bilirubin Total: 0.3 mg/dL (ref 0.0–1.2)
CO2: 24 mmol/L (ref 20–29)
Calcium: 9.4 mg/dL (ref 8.7–10.2)
Chloride: 101 mmol/L (ref 96–106)
Creatinine, Ser: 1.29 mg/dL — ABNORMAL HIGH (ref 0.76–1.27)
GFR calc Af Amer: 70 mL/min/{1.73_m2} (ref >59)
GFR calc non Af Amer: 61 mL/min/{1.73_m2} (ref >59)
Globulin, Total: 2.4 g/dL (ref 1.5–4.5)
Glucose: 92 mg/dL (ref 65–99)
Potassium: 5.1 mmol/L (ref 3.5–5.2)
Sodium: 141 mmol/L (ref 134–144)
Total Protein: 6.8 g/dL (ref 6.0–8.5)

## 2018-10-13 LAB — TESTOSTERONE: Testosterone: 329 ng/dL (ref 264–916)

## 2018-10-13 LAB — CBC WITH DIFFERENTIAL/PLATELET
Basophils Absolute: 0 10*3/uL (ref 0.0–0.2)
Basos: 1 %
EOS (ABSOLUTE): 0.4 10*3/uL (ref 0.0–0.4)
Eos: 7 %
Hematocrit: 42.8 % (ref 37.5–51.0)
Hemoglobin: 14 g/dL (ref 13.0–17.7)
Immature Grans (Abs): 0 10*3/uL (ref 0.0–0.1)
Immature Granulocytes: 1 %
Lymphocytes Absolute: 1.6 10*3/uL (ref 0.7–3.1)
Lymphs: 28 %
MCH: 24.6 pg — ABNORMAL LOW (ref 26.6–33.0)
MCHC: 32.7 g/dL (ref 31.5–35.7)
MCV: 75 fL — ABNORMAL LOW (ref 79–97)
Monocytes Absolute: 0.5 10*3/uL (ref 0.1–0.9)
Monocytes: 9 %
Neutrophils Absolute: 3.2 10*3/uL (ref 1.4–7.0)
Neutrophils: 54 %
Platelets: 206 10*3/uL (ref 150–450)
RBC: 5.7 x10E6/uL (ref 4.14–5.80)
RDW: 14.6 % (ref 11.6–15.4)
WBC: 5.9 10*3/uL (ref 3.4–10.8)

## 2018-10-13 LAB — PSA: Prostate Specific Ag, Serum: 0.4 ng/mL (ref 0.0–4.0)

## 2018-10-13 LAB — LIPID PANEL
Chol/HDL Ratio: 3 ratio (ref 0.0–5.0)
Cholesterol, Total: 170 mg/dL (ref 100–199)
HDL: 57 mg/dL (ref >39)
LDL Calculated: 96 mg/dL (ref 0–99)
Triglycerides: 87 mg/dL (ref 0–149)
VLDL Cholesterol Cal: 17 mg/dL (ref 5–40)

## 2018-10-16 ENCOUNTER — Telehealth: Payer: Self-pay | Admitting: Family Medicine

## 2018-10-16 NOTE — Telephone Encounter (Signed)
P.A. TESTOSTERONE °

## 2018-10-17 NOTE — Telephone Encounter (Signed)
PA approved, pt informed.

## 2018-10-24 ENCOUNTER — Other Ambulatory Visit: Payer: Self-pay | Admitting: Family Medicine

## 2018-10-24 DIAGNOSIS — E039 Hypothyroidism, unspecified: Secondary | ICD-10-CM

## 2018-11-20 ENCOUNTER — Other Ambulatory Visit: Payer: Self-pay | Admitting: Family Medicine

## 2018-11-20 DIAGNOSIS — E291 Testicular hypofunction: Secondary | ICD-10-CM

## 2018-11-24 ENCOUNTER — Other Ambulatory Visit: Payer: Self-pay | Admitting: Family Medicine

## 2018-11-24 DIAGNOSIS — E039 Hypothyroidism, unspecified: Secondary | ICD-10-CM

## 2018-12-05 DIAGNOSIS — Z23 Encounter for immunization: Secondary | ICD-10-CM | POA: Diagnosis not present

## 2018-12-13 DIAGNOSIS — Z20828 Contact with and (suspected) exposure to other viral communicable diseases: Secondary | ICD-10-CM | POA: Diagnosis not present

## 2019-01-02 ENCOUNTER — Other Ambulatory Visit: Payer: Self-pay | Admitting: Family Medicine

## 2019-01-02 DIAGNOSIS — E039 Hypothyroidism, unspecified: Secondary | ICD-10-CM

## 2019-01-03 ENCOUNTER — Telehealth: Payer: Self-pay | Admitting: Family Medicine

## 2019-01-03 DIAGNOSIS — E039 Hypothyroidism, unspecified: Secondary | ICD-10-CM

## 2019-01-03 MED ORDER — LEVOTHYROXINE SODIUM 88 MCG PO TABS: 88.0000 ug | ORAL_TABLET | Freq: Every day | ORAL | 0 refills | Status: DC

## 2019-01-03 NOTE — Telephone Encounter (Signed)
[facility]ill for 90 day supply for levothyroxine

## 2019-01-03 NOTE — Telephone Encounter (Signed)
No answer will call back to advise [facility]

## 2019-01-03 NOTE — Telephone Encounter (Signed)
I called a 90-day supply in.  Him know that we did not get a thyroid function test when his last visit so have him come in for TSH blood draw.  I will put it in when I know when he is coming in

## 2019-01-05 NOTE — Telephone Encounter (Signed)
Sent pt a my chart message. KH 

## 2019-01-17 ENCOUNTER — Other Ambulatory Visit: Payer: Self-pay

## 2019-01-17 ENCOUNTER — Ambulatory Visit: Payer: BC Managed Care – PPO | Admitting: Family Medicine

## 2019-01-17 ENCOUNTER — Encounter: Payer: Self-pay | Admitting: Family Medicine

## 2019-01-17 VITALS — BP 124/74 | HR 81 | Temp 96.0°F | Wt 233.4 lb

## 2019-01-17 DIAGNOSIS — E039 Hypothyroidism, unspecified: Secondary | ICD-10-CM

## 2019-01-17 DIAGNOSIS — Z79899 Other long term (current) drug therapy: Secondary | ICD-10-CM

## 2019-01-17 DIAGNOSIS — E291 Testicular hypofunction: Secondary | ICD-10-CM

## 2019-01-17 NOTE — Progress Notes (Signed)
   Subjective:    Patient ID: John Clay, male    DOB: 1960-06-23, 58 y.o.   MRN: [id number]  HPI He is here for a med check appointment.  He is now using 3 squirts of his testosterone and has noted an increase in his energy and stamina.  He also continues on Synthroid and it has been a year since he is have that checked.  No skin or hair changes, hot or cold intolerance.  He otherwise has no particular concerns or complaints.   Review of Systems     Objective:   Physical Exam Alert and in no distress otherwise not examined       Assessment & Plan:  Hypogonadism in male - Plan: Testosterone  Acquired hypothyroidism - Plan: TSH  Encounter for long-term (current) use of medications - Plan: Testosterone, TSH He will continue his present medications.  His MCV was slightly low and I therefore recommended a multivitamin with iron.

## 2019-01-18 LAB — TESTOSTERONE: Testosterone: 341 ng/dL (ref 264–916)

## 2019-01-18 LAB — TSH: TSH: 1.4 u[IU]/mL (ref 0.450–4.500)

## 2019-02-13 ENCOUNTER — Other Ambulatory Visit: Payer: Self-pay

## 2019-02-13 DIAGNOSIS — Z20822 Contact with and (suspected) exposure to covid-19: Secondary | ICD-10-CM

## 2019-02-14 LAB — NOVEL CORONAVIRUS, NAA: SARS-CoV-2, NAA: NOT DETECTED

## 2019-02-21 ENCOUNTER — Ambulatory Visit: Payer: BC Managed Care – PPO | Admitting: Family Medicine

## 2019-02-21 ENCOUNTER — Encounter: Payer: Self-pay | Admitting: Family Medicine

## 2019-02-21 ENCOUNTER — Other Ambulatory Visit: Payer: Self-pay

## 2019-02-21 VITALS — BP 132/82 | HR 94 | Temp 96.2°F | Wt 231.2 lb

## 2019-02-21 DIAGNOSIS — K625 Hemorrhage of anus and rectum: Secondary | ICD-10-CM | POA: Diagnosis not present

## 2019-02-21 DIAGNOSIS — K921 Melena: Secondary | ICD-10-CM

## 2019-02-21 LAB — CBC WITH DIFFERENTIAL/PLATELET
Basophils Absolute: 0.1 10*3/uL (ref 0.0–0.2)
Basos: 1 %
EOS (ABSOLUTE): 0 10*3/uL (ref 0.0–0.4)
Eos: 1 %
Hematocrit: 45.2 % (ref 37.5–51.0)
Hemoglobin: 14.8 g/dL (ref 13.0–17.7)
Immature Grans (Abs): 0 10*3/uL (ref 0.0–0.1)
Immature Granulocytes: 0 %
Lymphocytes Absolute: 1.5 10*3/uL (ref 0.7–3.1)
Lymphs: 22 %
MCH: 24.7 pg — ABNORMAL LOW (ref 26.6–33.0)
MCHC: 32.7 g/dL (ref 31.5–35.7)
MCV: 76 fL — ABNORMAL LOW (ref 79–97)
Monocytes Absolute: 0.6 10*3/uL (ref 0.1–0.9)
Monocytes: 9 %
Neutrophils Absolute: 4.6 10*3/uL (ref 1.4–7.0)
Neutrophils: 67 %
Platelets: 232 10*3/uL (ref 150–450)
RBC: 5.99 x10E6/uL — ABNORMAL HIGH (ref 4.14–5.80)
RDW: 16.4 % — ABNORMAL HIGH (ref 11.6–15.4)
WBC: 6.8 10*3/uL (ref 3.4–10.8)

## 2019-02-21 LAB — HEMOCCULT GUIAC POC 1CARD (OFFICE): Fecal Occult Blood, POC: POSITIVE — AB

## 2019-02-21 NOTE — Progress Notes (Signed)
   Subjective:    Patient ID: John Clay, male    DOB: October 09, 1960, 58 y.o.   MRN: [id number]  HPI He states that he has noted both bright red blood per rectum as well as black stools over the last 3 months.  He also states that over this timeframe he has had heartburn.  Does have a previous history of endoscopy as well as colonoscopy.  He does not complain of nausea, vomiting, abdominal pain. He apparently had a colonoscopy roughly 10 years ago.   Review of Systems     Objective:   Physical Exam Alert and in no distress.  Rectal exam shows no external lesions.  Digital rectal exam showed no palpable lesions, stool was brownish and guaiac positive.       Assessment & Plan:  Black stools - Plan: Hemoccult - 1 Card (office)  Bright red blood per rectum - Plan: Hemoccult - 1 Card (office) Since his close to the time of needing another colonoscopy I will go ahead and set that up.

## 2019-02-21 NOTE — Addendum Note (Signed)
Addended by: Elyse Jarvis on: 02/21/2019 01:12 PM   Modules accepted: Orders

## 2019-03-02 DIAGNOSIS — Z1159 Encounter for screening for other viral diseases: Secondary | ICD-10-CM | POA: Diagnosis not present

## 2019-03-06 DIAGNOSIS — R195 Other fecal abnormalities: Secondary | ICD-10-CM | POA: Diagnosis not present

## 2019-03-06 DIAGNOSIS — K625 Hemorrhage of anus and rectum: Secondary | ICD-10-CM | POA: Diagnosis not present

## 2019-03-06 DIAGNOSIS — K640 First degree hemorrhoids: Secondary | ICD-10-CM | POA: Diagnosis not present

## 2019-03-06 DIAGNOSIS — D120 Benign neoplasm of cecum: Secondary | ICD-10-CM | POA: Diagnosis not present

## 2019-03-06 LAB — HM COLONOSCOPY

## 2019-03-07 ENCOUNTER — Telehealth: Payer: Self-pay | Admitting: Family Medicine

## 2019-03-07 DIAGNOSIS — K529 Noninfective gastroenteritis and colitis, unspecified: Secondary | ICD-10-CM | POA: Insufficient documentation

## 2019-03-07 NOTE — Telephone Encounter (Signed)
Received requested records from [facility] 

## 2019-03-15 ENCOUNTER — Encounter: Payer: Self-pay | Admitting: Family Medicine

## 2019-03-15 DIAGNOSIS — D126 Benign neoplasm of colon, unspecified: Secondary | ICD-10-CM | POA: Insufficient documentation

## 2019-03-30 ENCOUNTER — Other Ambulatory Visit: Payer: Self-pay | Admitting: Family Medicine

## 2019-03-30 DIAGNOSIS — E039 Hypothyroidism, unspecified: Secondary | ICD-10-CM

## 2019-04-18 ENCOUNTER — Encounter: Payer: Self-pay | Admitting: Family Medicine

## 2019-05-21 ENCOUNTER — Other Ambulatory Visit: Payer: Self-pay | Admitting: Family Medicine

## 2019-05-21 DIAGNOSIS — E291 Testicular hypofunction: Secondary | ICD-10-CM

## 2019-05-22 ENCOUNTER — Other Ambulatory Visit: Payer: Self-pay

## 2019-05-22 ENCOUNTER — Ambulatory Visit: Payer: Self-pay | Attending: Internal Medicine

## 2019-05-22 DIAGNOSIS — Z23 Encounter for immunization: Secondary | ICD-10-CM | POA: Insufficient documentation

## 2019-05-22 NOTE — Telephone Encounter (Signed)
[facility] testosterone. Please advise [facility]

## 2019-05-22 NOTE — Progress Notes (Signed)
   Covid-19 Vaccination Clinic  Name:  John Clay    MRN: [id number] DOB: 1960-05-11  05/22/2019  John Clay was observed post Covid-19 immunization for 15 minutes without incidence. He was provided with Vaccine Information Sheet and instruction to access the V-Safe system.   John Clay was instructed to call 911 with any severe reactions post vaccine: Marland Kitchen Difficulty breathing  . Swelling of your face and throat  . A fast heartbeat  . A bad rash all over your body  . Dizziness and weakness    Immunizations Administered    Name Date Dose VIS Date Route   Pfizer COVID-19 Vaccine 05/22/2019  4:56 PM 0.3 mL 03/03/2019 Intramuscular   Manufacturer: [place]   Lot: [id number]   [place]: [id number]

## 2019-06-14 ENCOUNTER — Ambulatory Visit: Payer: Self-pay | Attending: Internal Medicine

## 2019-06-14 DIAGNOSIS — Z23 Encounter for immunization: Secondary | ICD-10-CM

## 2019-06-14 NOTE — Progress Notes (Signed)
   Covid-19 Vaccination Clinic  Name:  John Clay    MRN: [id number] DOB: Apr 06, 1960  06/14/2019  John Clay was observed post Covid-19 immunization for 15 minutes without incident. He was provided with Vaccine Information Sheet and instruction to access the V-Safe system.   John Clay was instructed to call 911 with any severe reactions post vaccine: Marland Kitchen Difficulty breathing  . Swelling of face and throat  . A fast heartbeat  . A bad rash all over body  . Dizziness and weakness   Immunizations Administered    Name Date Dose VIS Date Route   Pfizer COVID-19 Vaccine 06/14/2019 11:42 AM 0.3 mL 03/03/2019 Intramuscular   Manufacturer: [place]   Lot: [id number]   [place]: [id number]

## 2019-06-22 ENCOUNTER — Other Ambulatory Visit: Payer: Self-pay | Admitting: Family Medicine

## 2019-06-22 DIAGNOSIS — E559 Vitamin D deficiency, unspecified: Secondary | ICD-10-CM

## 2019-06-22 NOTE — Telephone Encounter (Signed)
[facility] to fill pt vitamin d . Please advise due to pt being on vitamin d for a while. [place]

## 2019-06-25 ENCOUNTER — Other Ambulatory Visit: Payer: Self-pay | Admitting: Family Medicine

## 2019-06-25 DIAGNOSIS — E559 Vitamin D deficiency, unspecified: Secondary | ICD-10-CM

## 2019-06-25 DIAGNOSIS — E039 Hypothyroidism, unspecified: Secondary | ICD-10-CM

## 2019-06-30 ENCOUNTER — Telehealth: Payer: Self-pay | Admitting: Family Medicine

## 2019-06-30 ENCOUNTER — Other Ambulatory Visit: Payer: Self-pay | Admitting: Family Medicine

## 2019-06-30 DIAGNOSIS — E559 Vitamin D deficiency, unspecified: Secondary | ICD-10-CM

## 2019-06-30 NOTE — Telephone Encounter (Signed)
Ok to refill? Pt has appt in june

## 2019-06-30 NOTE — Telephone Encounter (Signed)
Pt was called back from leaving a message on my machine. Per [employer] pt no longer needs the RX strength of Vit D. He can take OTC. PT was offered an appt to come in for recheck and he states that he will stay on OTC until his scheduled CPE in June.

## 2019-07-07 ENCOUNTER — Ambulatory Visit: Payer: 59 | Admitting: Family Medicine

## 2019-07-07 ENCOUNTER — Telehealth: Payer: Self-pay | Admitting: Family Medicine

## 2019-07-07 ENCOUNTER — Encounter: Payer: Self-pay | Admitting: Family Medicine

## 2019-07-07 VITALS — BP 126/80 | HR 86 | Temp 96.6°F | Wt 231.0 lb

## 2019-07-07 DIAGNOSIS — E039 Hypothyroidism, unspecified: Secondary | ICD-10-CM

## 2019-07-07 DIAGNOSIS — I10 Essential (primary) hypertension: Secondary | ICD-10-CM

## 2019-07-07 DIAGNOSIS — Z8571 Personal history of Hodgkin lymphoma: Secondary | ICD-10-CM | POA: Diagnosis not present

## 2019-07-07 DIAGNOSIS — E291 Testicular hypofunction: Secondary | ICD-10-CM | POA: Diagnosis not present

## 2019-07-07 DIAGNOSIS — Z13220 Encounter for screening for lipoid disorders: Secondary | ICD-10-CM

## 2019-07-07 DIAGNOSIS — Z125 Encounter for screening for malignant neoplasm of prostate: Secondary | ICD-10-CM

## 2019-07-07 NOTE — Telephone Encounter (Signed)
Appt is schedule for today. [place]

## 2019-07-07 NOTE — Telephone Encounter (Signed)
Office visit and blood work

## 2019-07-07 NOTE — Telephone Encounter (Signed)
Pt left message and needs [facility]ill [facility] Test[facility] [address]

## 2019-07-07 NOTE — Progress Notes (Signed)
   Subjective:    Patient ID: John Clay, male    DOB: Jul 13, 1960, 59 y.o.   MRN: [id number]  HPI He is here for medication check.  He does have a previous history of Hodgkin's lymphoma and gets regular follow-up at [facility].  He does have acquired hypothyroidism and is on Synthroid and having no difficulty with that.  He is also taking testosterone and does note that it helps with energy stamina and libido.  He continues on Toprol for his hypertension.   Review of Systems     Objective:   Physical Exam Alert and in no distress. Tympanic membranes and canals are normal. Pharyngeal area is normal. Neck is supple without adenopathy or thyromegaly. Cardiac exam shows a regular sinus rhythm without murmurs or gallops. Lungs are clear to auscultation.       Assessment & Plan:  Hypogonadism in male - Plan: Testosterone  History of Hodgkin's lymphoma  Essential hypertension  Acquired hypothyroidism - Plan: TSH  Screening for prostate cancer - Plan: PSA  Screening for lipid disorders - Plan: Lipid panel Follow-up pending blood results.  Also discussed the need for Shingrix when he turns 59 and to come in here for complete exam sometime later this year.

## 2019-07-08 LAB — PSA: Prostate Specific Ag, Serum: 0.5 ng/mL (ref 0.0–4.0)

## 2019-07-08 LAB — LIPID PANEL
Chol/HDL Ratio: 2.9 ratio (ref 0.0–5.0)
Cholesterol, Total: 159 mg/dL (ref 100–199)
HDL: 54 mg/dL (ref >39)
LDL Chol Calc (NIH): 89 mg/dL (ref 0–99)
Triglycerides: 84 mg/dL (ref 0–149)
VLDL Cholesterol Cal: 16 mg/dL (ref 5–40)

## 2019-07-08 LAB — TSH: TSH: 2.23 u[IU]/mL (ref 0.450–4.500)

## 2019-07-08 LAB — TESTOSTERONE: Testosterone: 465 ng/dL (ref 264–916)

## 2019-07-10 ENCOUNTER — Telehealth: Payer: Self-pay | Admitting: Family Medicine

## 2019-07-10 DIAGNOSIS — E291 Testicular hypofunction: Secondary | ICD-10-CM

## 2019-07-10 MED ORDER — TESTOSTERONE 30 MG/ACT TD SOLN: TRANSDERMAL | 1 refills | Status: DC

## 2019-07-10 NOTE — Telephone Encounter (Signed)
OT say strength is OK

## 2019-07-10 NOTE — Telephone Encounter (Signed)
Pt advised. KH 

## 2019-07-10 NOTE — Telephone Encounter (Signed)
Pt called and states that he needs his testosterone refilled. Please send to [place].  He also would like clarification on VIT D. Is he to continue on OTC or RX strength? Please advise pt at [phone].

## 2019-07-31 ENCOUNTER — Telehealth: Payer: Self-pay | Admitting: Family Medicine

## 2019-07-31 NOTE — Telephone Encounter (Signed)
He can postpone this for several months

## 2019-07-31 NOTE — Telephone Encounter (Signed)
Called pt to rescheduled his Cpe that is scheduled on 6-24 but he wanted me to check with you to see if that appt is still needed because he had his blood work done on 07-07-19 at his med check appt. Please advise

## 2019-09-08 ENCOUNTER — Other Ambulatory Visit: Payer: Self-pay | Admitting: Family Medicine

## 2019-09-08 DIAGNOSIS — E291 Testicular hypofunction: Secondary | ICD-10-CM

## 2019-09-08 NOTE — Telephone Encounter (Signed)
[facility] is requesting to fill pt testosterone. Please advise KH °

## 2019-09-14 ENCOUNTER — Encounter: Payer: Self-pay | Admitting: Family Medicine

## 2019-09-21 ENCOUNTER — Other Ambulatory Visit: Payer: Self-pay | Admitting: Family Medicine

## 2019-09-21 DIAGNOSIS — E039 Hypothyroidism, unspecified: Secondary | ICD-10-CM

## 2019-11-18 ENCOUNTER — Telehealth: Payer: Self-pay | Admitting: Family Medicine

## 2019-11-18 NOTE — Telephone Encounter (Signed)
P.A. TESTOSTERONE 30 MG 

## 2019-11-19 ENCOUNTER — Other Ambulatory Visit: Payer: Self-pay | Admitting: Family Medicine

## 2019-11-19 DIAGNOSIS — I10 Essential (primary) hypertension: Secondary | ICD-10-CM

## 2019-11-20 NOTE — Telephone Encounter (Signed)
P.A. approved, faxed pharmacy, pt informed

## 2019-12-01 ENCOUNTER — Other Ambulatory Visit: Payer: Self-pay

## 2019-12-01 ENCOUNTER — Other Ambulatory Visit: Payer: Self-pay | Admitting: Sleep Medicine

## 2019-12-01 DIAGNOSIS — I471 Supraventricular tachycardia: Secondary | ICD-10-CM

## 2019-12-02 ENCOUNTER — Encounter: Payer: Self-pay | Admitting: Family Medicine

## 2019-12-04 LAB — NOVEL CORONAVIRUS, NAA: SARS-CoV-2, NAA: NOT DETECTED

## 2019-12-12 ENCOUNTER — Other Ambulatory Visit: Payer: Self-pay

## 2019-12-12 ENCOUNTER — Ambulatory Visit (INDEPENDENT_AMBULATORY_CARE_PROVIDER_SITE_OTHER): Payer: No Typology Code available for payment source

## 2019-12-12 DIAGNOSIS — Z23 Encounter for immunization: Secondary | ICD-10-CM | POA: Diagnosis not present

## 2019-12-18 ENCOUNTER — Other Ambulatory Visit: Payer: Self-pay | Admitting: Family Medicine

## 2019-12-18 DIAGNOSIS — E039 Hypothyroidism, unspecified: Secondary | ICD-10-CM

## 2019-12-18 DIAGNOSIS — E291 Testicular hypofunction: Secondary | ICD-10-CM

## 2019-12-18 NOTE — Telephone Encounter (Signed)
[facility] to [facility] testosterone. Kh

## 2020-01-10 ENCOUNTER — Encounter: Payer: Self-pay | Admitting: Family Medicine

## 2020-01-10 ENCOUNTER — Other Ambulatory Visit: Payer: Self-pay

## 2020-01-10 ENCOUNTER — Ambulatory Visit: Payer: No Typology Code available for payment source | Admitting: Family Medicine

## 2020-01-10 VITALS — BP 128/78 | HR 83 | Temp 97.1°F | Ht 74.0 in | Wt 228.0 lb

## 2020-01-10 DIAGNOSIS — E559 Vitamin D deficiency, unspecified: Secondary | ICD-10-CM

## 2020-01-10 DIAGNOSIS — G4733 Obstructive sleep apnea (adult) (pediatric): Secondary | ICD-10-CM | POA: Diagnosis not present

## 2020-01-10 DIAGNOSIS — Z23 Encounter for immunization: Secondary | ICD-10-CM

## 2020-01-10 DIAGNOSIS — J301 Allergic rhinitis due to pollen: Secondary | ICD-10-CM

## 2020-01-10 DIAGNOSIS — Z0000 Encounter for general adult medical examination without abnormal findings: Secondary | ICD-10-CM | POA: Diagnosis not present

## 2020-01-10 DIAGNOSIS — Z125 Encounter for screening for malignant neoplasm of prostate: Secondary | ICD-10-CM

## 2020-01-10 DIAGNOSIS — I341 Nonrheumatic mitral (valve) prolapse: Secondary | ICD-10-CM | POA: Diagnosis not present

## 2020-01-10 DIAGNOSIS — Z8571 Personal history of Hodgkin lymphoma: Secondary | ICD-10-CM | POA: Diagnosis not present

## 2020-01-10 DIAGNOSIS — D126 Benign neoplasm of colon, unspecified: Secondary | ICD-10-CM

## 2020-01-10 DIAGNOSIS — E039 Hypothyroidism, unspecified: Secondary | ICD-10-CM

## 2020-01-10 DIAGNOSIS — I10 Essential (primary) hypertension: Secondary | ICD-10-CM

## 2020-01-10 DIAGNOSIS — E291 Testicular hypofunction: Secondary | ICD-10-CM

## 2020-01-10 DIAGNOSIS — Z13220 Encounter for screening for lipoid disorders: Secondary | ICD-10-CM

## 2020-01-10 LAB — LIPID PANEL

## 2020-01-10 MED ORDER — LEVOTHYROXINE SODIUM 88 MCG PO TABS: 88.0000 ug | ORAL_TABLET | Freq: Every day | ORAL | 3 refills | Status: DC

## 2020-01-10 MED ORDER — METOPROLOL SUCCINATE ER 50 MG PO TB24: ORAL_TABLET | ORAL | 3 refills | Status: DC

## 2020-01-10 NOTE — Progress Notes (Signed)
   Subjective:    Patient ID: John Clay, male    DOB: 1960-11-27, 59 y.o.   MRN: [id number]  HPI He is here for complete examination.  He does have underlying allergies and is using OTC medications with good results.  He does have a history of colonic polyps and will be seen in the roughly 5 years for repeat.  He is using his CPAP for the OSA and does note an improvement in his energy and stamina.  He continues on testosterone.  He is taking metoprolol for his hypertension.  He also has a history of mitral valve prolapse.  He has remote history of Hodgkin's disease and is still being seen yearly at [facility] mainly for surveillance.  They apparently have done a recent carotid Doppler on him as part of that study.  He is also taking levothyroxine for treatment of hypothyroidism.  He has been on 50,000 units of vitamin D for underlying history of vitamin D deficiency.  His work and home life are going quite well.  Family and social history as well as health maintenance and immunizations was otherwise negative   Review of Systems  All other systems reviewed and are negative.      Objective:   Physical Exam Alert and in no distress. Tympanic membranes and canals are normal. Pharyngeal area is normal. Neck is supple without adenopathy or thyromegaly. Cardiac exam shows a regular sinus rhythm without murmurs or gallops. Lungs are clear to auscultation.  Abdominal exam shows no masses or tenderness with midline abdominal scar noted.       Assessment & Plan:  Routine general medical examination at a health care facility - Plan: Comprehensive metabolic panel, CBC with Differential/Platelet, Lipid panel  OSA (obstructive sleep apnea)  History of Hodgkin's lymphoma  Essential hypertension - Plan: Comprehensive metabolic panel, CBC with Differential/Platelet, metoprolol succinate (TOPROL-XL) 50 MG 24 hr tablet  MVP (mitral valve prolapse)  Hypogonadism in male - Plan: Testosterone,  PSA  Acquired hypothyroidism - Plan: TSH, levothyroxine (SYNTHROID) 88 MCG tablet  Seasonal allergic rhinitis due to pollen  Tubular adenoma of colon  Screening for lipid disorders  Need for shingles vaccine - Plan: Varicella-zoster vaccine IM (Shingrix)  Vitamin D deficiency - Plan: VITAMIN D 25 Hydroxy (Vit-D Deficiency, Fractures)  Screening for prostate cancer - Plan: PSA Encouraged him to continue to take good care of himself.  Continue to treat the allergies as needed.  He does have a follow-up appointment in 5 years for repeat colonoscopy.  Recommend continuing on the vitamin D supplementation.

## 2020-01-11 LAB — CBC WITH DIFFERENTIAL/PLATELET
Basophils Absolute: 0.1 10*3/uL (ref 0.0–0.2)
Basos: 1 %
EOS (ABSOLUTE): 0.3 10*3/uL (ref 0.0–0.4)
Eos: 4 %
Hematocrit: 46.5 % (ref 37.5–51.0)
Hemoglobin: 14.9 g/dL (ref 13.0–17.7)
Immature Grans (Abs): 0 10*3/uL (ref 0.0–0.1)
Immature Granulocytes: 1 %
Lymphocytes Absolute: 1.5 10*3/uL (ref 0.7–3.1)
Lymphs: 23 %
MCH: 24.8 pg — ABNORMAL LOW (ref 26.6–33.0)
MCHC: 32 g/dL (ref 31.5–35.7)
MCV: 78 fL — ABNORMAL LOW (ref 79–97)
Monocytes Absolute: 0.6 10*3/uL (ref 0.1–0.9)
Monocytes: 9 %
Neutrophils Absolute: 4.1 10*3/uL (ref 1.4–7.0)
Neutrophils: 62 %
Platelets: 207 10*3/uL (ref 150–450)
RBC: 6 x10E6/uL — ABNORMAL HIGH (ref 4.14–5.80)
RDW: 15.9 % — ABNORMAL HIGH (ref 11.6–15.4)
WBC: 6.5 10*3/uL (ref 3.4–10.8)

## 2020-01-11 LAB — LIPID PANEL
Chol/HDL Ratio: 2.9 ratio (ref 0.0–5.0)
Cholesterol, Total: 195 mg/dL (ref 100–199)
HDL: 67 mg/dL (ref >39)
LDL Chol Calc (NIH): 115 mg/dL — ABNORMAL HIGH (ref 0–99)
Triglycerides: 73 mg/dL (ref 0–149)
VLDL Cholesterol Cal: 13 mg/dL (ref 5–40)

## 2020-01-11 LAB — COMPREHENSIVE METABOLIC PANEL
ALT: 32 IU/L (ref 0–44)
AST: 31 IU/L (ref 0–40)
Albumin/Globulin Ratio: 1.8 (ref 1.2–2.2)
Albumin: 4.6 g/dL (ref 3.8–4.9)
Alkaline Phosphatase: 77 IU/L (ref 44–121)
BUN/Creatinine Ratio: 13 (ref 10–24)
BUN: 15 mg/dL (ref 8–27)
Bilirubin Total: 0.5 mg/dL (ref 0.0–1.2)
CO2: 25 mmol/L (ref 20–29)
Calcium: 9.4 mg/dL (ref 8.6–10.2)
Chloride: 102 mmol/L (ref 96–106)
Creatinine, Ser: 1.2 mg/dL (ref 0.76–1.27)
GFR calc Af Amer: 76 mL/min/{1.73_m2} (ref >59)
GFR calc non Af Amer: 65 mL/min/{1.73_m2} (ref >59)
Globulin, Total: 2.6 g/dL (ref 1.5–4.5)
Glucose: 95 mg/dL (ref 65–99)
Potassium: 4.4 mmol/L (ref 3.5–5.2)
Sodium: 141 mmol/L (ref 134–144)
Total Protein: 7.2 g/dL (ref 6.0–8.5)

## 2020-01-11 LAB — PSA: Prostate Specific Ag, Serum: 0.7 ng/mL (ref 0.0–4.0)

## 2020-01-11 LAB — TSH: TSH: 3.51 u[IU]/mL (ref 0.450–4.500)

## 2020-01-11 LAB — VITAMIN D 25 HYDROXY (VIT D DEFICIENCY, FRACTURES): Vit D, 25-Hydroxy: 47.2 ng/mL (ref 30.0–100.0)

## 2020-03-26 ENCOUNTER — Encounter: Payer: Self-pay | Admitting: Medical

## 2020-03-26 ENCOUNTER — Other Ambulatory Visit: Payer: Self-pay

## 2020-03-26 ENCOUNTER — Telehealth: Payer: No Typology Code available for payment source | Admitting: Medical

## 2020-03-26 ENCOUNTER — Other Ambulatory Visit: Payer: Self-pay | Admitting: Medical

## 2020-03-26 VITALS — Ht 75.0 in | Wt 228.0 lb

## 2020-03-26 DIAGNOSIS — R52 Pain, unspecified: Secondary | ICD-10-CM

## 2020-03-26 DIAGNOSIS — R0981 Nasal congestion: Secondary | ICD-10-CM

## 2020-03-26 DIAGNOSIS — U071 COVID-19: Secondary | ICD-10-CM

## 2020-03-26 MED ORDER — EMERGEN-C IMMUNE PLUS PO PACK: 1.0000 | PACK | Freq: Two times a day (BID) | ORAL | 0 refills | Status: DC

## 2020-03-26 NOTE — Progress Notes (Signed)
Subjective:     Patient ID: John Clay, male   DOB: 12-30-1960, 60 y.o.   MRN: [id number]  This visit type was conducted due to national recommendations for restrictions regarding the COVID-19 Pandemic (e.g. social distancing) in an effort to limit this patient's exposure and mitigate transmission in our community.  Due to their co-morbid illnesses, this patient is at least at moderate risk for complications without adequate follow up.  This format is felt to be most appropriate for this patient at this time.    Documentation for virtual audio and video telecommunications through [place] encounter:  The patient was located at home. The provider was located in the office. The patient did consent to this visit and is aware of possible charges through their insurance for this visit.  The other persons participating in this telemedicine service were none. Time spent on call was 20 minutes and in review of previous records 20 minutes total.  This virtual service is not related to other E/M service within previous 7 days.   HPI Chief Complaint  Patient presents with  . Covid Positive    Symptoms started 03/24/20. Nasal congestion, body ache, fever last night, scratchy throat. Positive at home rapid test last night    Virtual consult for illness.  Has had symptoms for 2 days including nasal congestion, body aches, fever, scratchy throat.   Fever broke this morning.  Has fatigue.  Has mild cough.  No NVD.   No SOB or wheezing.  No loss of taste or smell.   Last night did a home rapid Covid test that was positive.      Fluid intake is ok, drinking a good amount of water.  Using OTC allegra D for congestion in head and nose, and that helped.  Was going to use some alka seltzer.    Has had the full vaccine series including booster for Covid  No other aggravating or relieving factors. No other complaint.  Past Medical History:  Diagnosis Date  . Allergic rhinitis   . Heart palpitations    . Hodgkin's lymphoma (HCC) 1983   Treated with radiation therapy  . Hypothyroidism    Secondary to radiation therapy   Current Outpatient Medications on File Prior to Visit  Medication Sig Dispense Refill  . levothyroxine (SYNTHROID) 88 MCG tablet Take 1 tablet (88 mcg total) by mouth daily. 90 tablet 3  . metoprolol succinate (TOPROL-XL) 50 MG 24 hr tablet Take with or immediately following a meal. 90 tablet 3  . Multiple Vitamin (MULTI-VITAMINS) TABS Take 1 tablet by mouth daily.    . Nutritional Supplements (VITAMIN D BOOSTER PO) Take by mouth.    . Testosterone 30 MG/ACT SOLN APPLY 2 PUMPS DAILY 90 mL 2  . FEXOFENADINE HCL PO Take by mouth. (Patient not taking: Reported on 03/26/2020)    . fluticasone (FLONASE) 50 MCG/ACT nasal spray Place 1 spray into both nostrils daily as needed for allergies. (Patient not taking: Reported on 03/26/2020)    . Vitamin D, Ergocalciferol, (DRISDOL) 1.25 MG (50000 UT) CAPS capsule TAKE 1 CAPSULE (50,000 UNITS TOTAL) BY MOUTH EVERY 7 (SEVEN) DAYS. (Patient not taking: Reported on 03/26/2020) 12 capsule 3   No current facility-administered medications on file prior to visit.    Review of Systems As in subjective    Objective:   Physical Exam Due to coronavirus pandemic stay at home measures, patient visit was virtual and they were not examined in person.   Ht 6\' 3"  (1.905 m)  Wt 228 lb (103.4 kg)   BMI 28.50 kg/m   Gen: wd, wn, nad No obvious wheezing or dyspnea      Assessment:     Encounter Diagnoses  Name Primary?  . COVID-19 virus infection Yes  . Body aches   . Head congestion        Plan:     We discussed symptoms and concerns.  He has positive symptoms and a positive home Covid test.  We discussed typical timeframe for symptoms, discussed hydration, fever control with Tylenol or ibuprofen, vitamin pack as below, continue over-the-counter cold medicine but do not duplicate this with Sudafed.  Add nasal saline and salt water  gargles.  Advised if much worse in the coming days such as difficulty breathing, dehydration or just overall way worse than call back or get reevaluated.  He is fully vaccinated.  Answered his questions and discussed typical quarantine.  Eduin was seen today for covid positive.  Diagnoses and all orders for this visit:  COVID-19 virus infection  Body aches  Head congestion  Other orders -     Multiple Vitamins-Minerals (EMERGEN-C IMMUNE PLUS) PACK; Take 1 tablet by mouth 2 (two) times daily.  f/u prn

## 2020-04-05 ENCOUNTER — Other Ambulatory Visit: Payer: Self-pay | Admitting: Family Medicine

## 2020-04-05 DIAGNOSIS — E291 Testicular hypofunction: Secondary | ICD-10-CM

## 2020-04-05 NOTE — Telephone Encounter (Signed)
[facility] is requesting to fill pt testosterone. Please advise KH °

## 2020-04-05 NOTE — Telephone Encounter (Signed)
Pt was advised Kh 

## 2020-04-05 NOTE — Telephone Encounter (Signed)
Needs blood work

## 2020-04-26 ENCOUNTER — Other Ambulatory Visit: Payer: Self-pay

## 2020-04-26 ENCOUNTER — Ambulatory Visit: Payer: No Typology Code available for payment source | Admitting: Family Medicine

## 2020-04-26 ENCOUNTER — Encounter: Payer: Self-pay | Admitting: Family Medicine

## 2020-04-26 VITALS — BP 136/86 | HR 81 | Temp 97.6°F | Wt 234.0 lb

## 2020-04-26 DIAGNOSIS — Z8616 Personal history of COVID-19: Secondary | ICD-10-CM

## 2020-04-26 DIAGNOSIS — E291 Testicular hypofunction: Secondary | ICD-10-CM | POA: Diagnosis not present

## 2020-04-26 DIAGNOSIS — Z23 Encounter for immunization: Secondary | ICD-10-CM

## 2020-04-26 NOTE — Progress Notes (Signed)
   Subjective:    Patient ID: John Clay, male    DOB: 1961-03-02, 60 y.o.   MRN: [id number]  HPI He is here for recheck.  He did have Covid in early 2000 and states that at this point he is totally back to normal.  No fever, chills, smell or taste change, cough or congestion.  Review of the record indicates we need to follow-up on his testosterone.  He also received a shingles vaccine and needs a repeat of that.   Review of Systems     Objective:   Physical Exam Alert and in no distress otherwise not examined       Assessment & Plan:  Hypogonadism in male - Plan: Testosterone  Need for shingles vaccine - Plan: Varicella-zoster vaccine IM  History of COVID-19 Continue on present testosterone dosing.

## 2020-04-27 LAB — TESTOSTERONE: Testosterone: 526 ng/dL (ref 264–916)

## 2020-05-28 ENCOUNTER — Other Ambulatory Visit: Payer: Self-pay | Admitting: Family Medicine

## 2020-05-28 ENCOUNTER — Telehealth: Payer: Self-pay | Admitting: Family Medicine

## 2020-05-28 DIAGNOSIS — E291 Testicular hypofunction: Secondary | ICD-10-CM

## 2020-05-28 NOTE — Telephone Encounter (Signed)
Pt called for refill of testosterone. Please send to [facility]. Pt can be reached at [phone]. Sending to [place] as Kendrick Fries is not in.

## 2020-05-28 NOTE — Telephone Encounter (Signed)
[facility]. Please advise [facility]

## 2020-05-29 ENCOUNTER — Other Ambulatory Visit: Payer: Self-pay | Admitting: Medical

## 2020-05-29 DIAGNOSIS — E291 Testicular hypofunction: Secondary | ICD-10-CM

## 2020-05-29 MED ORDER — TESTOSTERONE 30 MG/ACT TD SOLN: TRANSDERMAL | 0 refills | Status: DC

## 2020-05-29 NOTE — Telephone Encounter (Signed)
Rx sent 

## 2020-07-03 ENCOUNTER — Other Ambulatory Visit: Payer: Self-pay

## 2020-07-03 ENCOUNTER — Ambulatory Visit (INDEPENDENT_AMBULATORY_CARE_PROVIDER_SITE_OTHER): Payer: No Typology Code available for payment source

## 2020-07-03 DIAGNOSIS — Z23 Encounter for immunization: Secondary | ICD-10-CM | POA: Diagnosis not present

## 2020-08-07 ENCOUNTER — Other Ambulatory Visit: Payer: Self-pay | Admitting: Medical

## 2020-08-07 DIAGNOSIS — E291 Testicular hypofunction: Secondary | ICD-10-CM

## 2020-08-09 ENCOUNTER — Other Ambulatory Visit: Payer: Self-pay | Admitting: Family Medicine

## 2020-08-09 ENCOUNTER — Other Ambulatory Visit: Payer: Self-pay

## 2020-08-09 DIAGNOSIS — E291 Testicular hypofunction: Secondary | ICD-10-CM

## 2020-08-09 MED ORDER — TESTOSTERONE 30 MG/ACT TD SOLN: TRANSDERMAL | 3 refills | Status: DC

## 2020-10-08 ENCOUNTER — Telehealth: Payer: No Typology Code available for payment source | Admitting: Family Medicine

## 2020-10-08 ENCOUNTER — Other Ambulatory Visit: Payer: Self-pay

## 2020-10-08 ENCOUNTER — Encounter: Payer: Self-pay | Admitting: Family Medicine

## 2020-10-08 VITALS — BP 134/82 | Temp 97.7°F | Wt 234.0 lb

## 2020-10-08 DIAGNOSIS — J019 Acute sinusitis, unspecified: Secondary | ICD-10-CM | POA: Diagnosis not present

## 2020-10-08 MED ORDER — AMOXICILLIN-POT CLAVULANATE 875-125 MG PO TABS: 1.0000 | ORAL_TABLET | Freq: Two times a day (BID) | ORAL | 0 refills | Status: DC

## 2020-10-08 NOTE — Progress Notes (Signed)
   Subjective:    Patient ID: John Clay, male    DOB: 1960-05-07, 60 y.o.   MRN: [id number]  HPI Documentation for virtual audio and video telecommunications through [place] encounter: The patient was located at home. 2 patient identifiers used.  The provider was located in the office. The patient did consent to this visit and is aware of possible charges through their insurance for this visit. The other persons participating in this telemedicine service were none. Time spent on call was 5 minutes and in review of previous records >20 minutes total for counseling and coordination of care. This virtual service is not related to other E/M service within previous 7 days.  He complains of a 30-month history of nasal congestion and clear rhinorrhea but no PND, headache, fever, chills, earache, cough or upper tooth discomfort.  He does have seasonal allergies but not this time a year.  He does occasionally smoke.  He did do a COVID test which was negative at some point along the way.  Review of Systems     Objective:   Physical Exam Alert and in no distress with slightly nasal sounding voice.       Assessment & Plan:  Acute sinusitis, recurrence not specified, unspecified location - Plan: amoxicillin-clavulanate (AUGMENTIN) 875-125 MG tablet I think that the symptoms are probably sinusitis related.  I will give him Augmentin to cover for anaerobes.  He is to call me if not entirely better when he finishes the antibiotic.  He expressed understanding of this.

## 2020-11-15 ENCOUNTER — Encounter: Payer: Self-pay | Admitting: *Deleted

## 2020-11-15 ENCOUNTER — Other Ambulatory Visit: Payer: Self-pay

## 2020-11-15 ENCOUNTER — Encounter: Payer: Self-pay | Admitting: Cardiology

## 2020-11-15 ENCOUNTER — Telehealth: Payer: Self-pay | Admitting: Interventional Cardiology

## 2020-11-15 ENCOUNTER — Encounter: Payer: Self-pay | Admitting: Medical

## 2020-11-15 ENCOUNTER — Ambulatory Visit: Payer: No Typology Code available for payment source | Admitting: Cardiology

## 2020-11-15 ENCOUNTER — Ambulatory Visit: Payer: No Typology Code available for payment source | Admitting: Medical

## 2020-11-15 VITALS — Temp 97.6°F | Wt 238.0 lb

## 2020-11-15 VITALS — BP 153/87 | HR 83 | Temp 98.3°F | Resp 17 | Ht 75.0 in | Wt 237.6 lb

## 2020-11-15 DIAGNOSIS — R002 Palpitations: Secondary | ICD-10-CM | POA: Diagnosis not present

## 2020-11-15 DIAGNOSIS — R0789 Other chest pain: Secondary | ICD-10-CM | POA: Insufficient documentation

## 2020-11-15 DIAGNOSIS — R9431 Abnormal electrocardiogram [ECG] [EKG]: Secondary | ICD-10-CM

## 2020-11-15 DIAGNOSIS — R0989 Other specified symptoms and signs involving the circulatory and respiratory systems: Secondary | ICD-10-CM | POA: Insufficient documentation

## 2020-11-15 DIAGNOSIS — R42 Dizziness and giddiness: Secondary | ICD-10-CM | POA: Insufficient documentation

## 2020-11-15 DIAGNOSIS — I10 Essential (primary) hypertension: Secondary | ICD-10-CM

## 2020-11-15 DIAGNOSIS — E039 Hypothyroidism, unspecified: Secondary | ICD-10-CM | POA: Diagnosis not present

## 2020-11-15 DIAGNOSIS — G4733 Obstructive sleep apnea (adult) (pediatric): Secondary | ICD-10-CM | POA: Diagnosis not present

## 2020-11-15 DIAGNOSIS — Z8571 Personal history of Hodgkin lymphoma: Secondary | ICD-10-CM

## 2020-11-15 DIAGNOSIS — R079 Chest pain, unspecified: Secondary | ICD-10-CM

## 2020-11-15 MED ORDER — AMLODIPINE BESYLATE 5 MG PO TABS: 5.0000 mg | ORAL_TABLET | Freq: Every day | ORAL | 3 refills | Status: DC

## 2020-11-15 NOTE — Telephone Encounter (Signed)
Per Dr. Faythe Casa- Mr. Ninfa Linden needs a 2D Doppler echocardiogram and stress Myoview as soon as possible.  Indication is prior chest radiation for Hodgkin's lymphoma, chest pain, and trifascicular block on EKG.   He is going to cancel those tests with [facility].  Order a bilateral carotid study for [place] as well.  He has a prior history of radiation in the neck chest and abdomen for Hodgkin's lymphoma greater than 30 years ago.  Spoke with pt and reviewed recommendations per Dr. Tamala Julian. Went over instructions for stress test.  Pt verbalized understanding and was appreciative for call.   Will route to Dr. Tamala Julian to sign attestation.

## 2020-11-15 NOTE — Progress Notes (Signed)
Subjective:  John Clay is a 60 y.o. male who presents for Chief Complaint  Patient presents with   Chest Pain    Started mid August  No sob Some dizziness or light headed     Here for not feeling well. Been feeling tightness behind breastbone the past week or so, intermittent light headedness.   The tightness has been constant, worse with deep breathing.  Can also feel the same pressure bending over.   No nausea, no SOB, no sweats. No numbness or tingling.     Exercise - goes to the gym twice per week with cardio and weightlifting.  Just got back from vacation in [place] this week, so no exercise this week.     Last Thursday when exercising did feel the tightness but not worsened by exercise.   Does have hx/o GERD and this feels different.    Has had some flutters of heart in the past week, felt lightheaded at times.   The chest tightness started before vacation but he has drank more heavily with alcohol this week on vacation, about 4-6 alcoholic drinks for several days.    No other aggravating or relieving factors.    No other c/o.  Past Medical History:  Diagnosis Date   Allergic rhinitis    Heart palpitations    Hodgkin's lymphoma ([place]) 1983   Treated with radiation therapy   Hypothyroidism    Secondary to radiation therapy   Current Outpatient Medications on File Prior to Visit  Medication Sig Dispense Refill   levothyroxine (SYNTHROID) 88 MCG tablet Take 1 tablet (88 mcg total) by mouth daily. 90 tablet 3   metoprolol succinate (TOPROL-XL) 50 MG 24 hr tablet Take with or immediately following a meal. 90 tablet 3   Multiple Vitamin (MULTI-VITAMINS) TABS Take 1 tablet by mouth daily.     Testosterone 30 MG/ACT SOLN APPLY 1 PUMP TO EACH ARM DAILY 90 mL 3   No current facility-administered medications on file prior to visit.     The following portions of the patient's history were reviewed and updated as appropriate: allergies, current medications, past family  history, past medical history, past social history, past surgical history and problem list.  ROS Otherwise as in subjective above  Objective: Temp 97.6 F (36.4 C)   Wt 238 lb (108 kg)   SpO2 98%   BMI 29.75 kg/m   Wt Readings from Last 3 Encounters:  11/15/20 238 lb (108 kg)  10/08/20 234 lb (106.1 kg)  04/26/20 234 lb (106.1 kg)   BP Readings from Last 3 Encounters:  10/08/20 134/82  04/26/20 136/86  01/10/20 128/78   BP today 144/76   General appearance: alert, no distress, well developed, well nourished, African American male Chest wall with somewhat of asymmetry of breast tissue from radiation therapy, giving appearance of pectus excavatum Neck: supple, no lymphadenopathy, no thyromegaly, no masses, on JVD or bruit Heart: 2/6 faint murmur heard best in right upper sternal border, otherwise RRR, normal S1, S2 Lungs: CTA bilaterally, no wheezes, rhonchi, or rales Pulses: 2+ radial pulses, 2+ pedal pulses, normal cap refill Ext: no edema Neuro: nonfocal exam   EKG indication chest pressure Rate 80 bpm, PR 202 ms, QRS 138 ms, QTC 440 ms, axis -63 degrees, normal sinus rhythm, left axis deviation, right bundle branch block, left ventricular hypertrophy and QRS widening, compared to prior EKG 2019, there is new QRS widening V1, V2, 2 and 3, there is new ST changes 2, 3, V1,  V2 V3      Assessment: Encounter Diagnoses  Name Primary?   Essential hypertension Yes   OSA (obstructive sleep apnea)    Acquired hypothyroidism    History of Hodgkin's lymphoma    Chest pressure    Palpitation    Lightheaded      Plan: We discussed his symptoms, new EKG changes.  I called and spoke to Dr. Virgina Clay, [facility] who reviewed EKG and history.   Given new EKG findings, he will see him now.     Patient will report there now for evaluation.  John Clay was seen today for chest pain.  Diagnoses and all orders for this visit:  Essential hypertension -     EKG  12-Lead  OSA (obstructive sleep apnea)  Acquired hypothyroidism  History of Hodgkin's lymphoma  Chest pressure -     EKG 12-Lead  Palpitation -     EKG 12-Lead -     EKG 12-Lead  Lightheaded -     EKG 12-Lead -     EKG 12-Lead   Follow up: with cardiology

## 2020-11-15 NOTE — Progress Notes (Signed)
Patient referred by Denita Lung, MD for chest pain  Subjective:   John Clay, male    DOB: June 07, 1960, 60 y.o.   MRN: [id number]   Chief Complaint  Patient presents with   New Patient (Initial Visit)   Abnormal ECG     HPI  60 y.o. 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  He is active, works out twice a week with a Physiological scientist, including cardio and weight training.  Patient has had retrosternal constant pain for past 1 week.  There is no worsening with exertion, but notices worsening with deep breathing and certain movements.  He denies any shortness of breath, orthopnea, PND, leg edema symptoms.  He is currently on metoprolol for several years for mitral prolapse and palpitations.  He smokes an occasional cigar, but does not smoke regular basis.  There is no known premature CAD history in the family.  Had occasional episode of lightheadedness several days ago, but has not had any recurrence since.  Blood pressure usually stays elevated around 145/75 mmHg.  Past Medical History:  Diagnosis Date   Allergic rhinitis    Heart palpitations    Hodgkin's lymphoma ([place]) 1983   Treated with radiation therapy   Hypothyroidism    Secondary to radiation therapy     Past Surgical History:  Procedure Laterality Date   SPLENECTOMY       Social History   Tobacco Use  Smoking Status Some Days   Types: Cigars  Smokeless Tobacco Never    Social History   Substance and Sexual Activity  Alcohol Use Yes   Alcohol/week: 7.0 standard drinks   Types: 7 Glasses of wine per week   Comment: occ     Family History  Problem Relation Age of Onset   Healthy Mother    Kidney disease Father    Hypertension Father       Current Outpatient Medications on File Prior to Visit  Medication Sig Dispense Refill   levothyroxine (SYNTHROID) 88 MCG tablet Take 1 tablet (88 mcg total) by mouth daily. 90 tablet 3   metoprolol succinate (TOPROL-XL) 50 MG 24 hr tablet Take with or immediately following a meal. 90 tablet 3   Multiple Vitamin (MULTI-VITAMINS) TABS Take 1 tablet by mouth daily.     Testosterone 30 MG/ACT SOLN APPLY 1 PUMP TO EACH ARM DAILY 90 mL 3   No current facility-administered medications on file prior to visit.    Cardiovascular and other pertinent studies:  EKG 11/15/2020: Sinus rhythm RBBB., LAFB-new since previous EKG Early repolarization-unchanged compared to previous EKG  Recent labs: 01/10/2020: Glucose 95, BUN/Cr 15/1.2. EGFR 76. Na/K 141/4.4. Rest of the CMP normal H/H 14/46. MCV 78. Platelets 207 HbA1C N/A Chol 195, TG 73, HDL 67, LDL 115 TSH 3.5 normal    Review of Systems  Cardiovascular:  Positive for chest pain. Negative for dyspnea on exertion, leg swelling, palpitations and syncope.        Vitals:   11/15/20 1210 11/15/20 1216  BP: (!) 171/76 (!) 153/87  Pulse: 87 83  Resp: 17   Temp: 98.3 F (36.8 C)   SpO2: 98% 98%     Body mass index is 29.75 kg/m. Filed Weights   11/15/20 1210  Weight: 237 lb 9.6 oz (107.8 kg)  Objective:   Physical Exam Vitals and nursing note reviewed.  Constitutional:      General: He is not in acute distress. Neck:     Vascular: No JVD.  Cardiovascular:     Rate and Rhythm: Normal rate and regular rhythm.     Pulses:          Carotid pulses are  on the left side with bruit.    Heart sounds: Murmur heard.  High-pitched blowing holosystolic murmur is present with a grade of 2/6 at the apex.  Pulmonary:     Effort: Pulmonary effort is normal.     Breath sounds: Normal breath sounds. No wheezing or rales.  Musculoskeletal:     Right lower leg: No edema.     Left lower leg: No edema.        Assessment &  Recommendations:    60 y.o. African-American male with hypertension, hypothyroidism, h/o mitral valve prolapse, palpitations, referred for chest pain and abnormal EKG   Chest pain, abnormal EKG: Chest pain appears musculoskeletal or pleuritic in nature.  Less likely to be angina.  EKG shows new right bundle branch block and left anterior fascicular block, possibly related to his hypertension.  Recommend echocardiogram and CT cardiac scoring.  Echocardiogram will also evaluate for mitral valve prolapse.  If pain does not improve in next 2 weeks, or CT cardiac scoring shows elevated calcium score, will then obtain exercise nuclear stress testing.  Hypertension: Started amlodipine 5 mg daily.  Left carotid bruit: Will check carotid ultrasound.  Thank you for referring the patient to [place]. Please feel free to contact with any questions.   Nigel Mormon, MD Pager: [phone] Office: [phone]

## 2020-11-18 ENCOUNTER — Ambulatory Visit: Payer: No Typology Code available for payment source | Admitting: Cardiology

## 2020-11-18 ENCOUNTER — Telehealth (HOSPITAL_COMMUNITY): Payer: Self-pay | Admitting: *Deleted

## 2020-11-18 NOTE — Telephone Encounter (Signed)
Patient given detailed instructions per Myocardial Perfusion Study Information Sheet for the test on 11/20/20 Patient notified to arrive 15 minutes early and that it is imperative to arrive on time for appointment to keep from having the test rescheduled.  If you need to cancel or reschedule your appointment, please call the office within 24 hours of your appointment. . Patient verbalized understanding. Shonica Weier Jacqueline   

## 2020-11-20 ENCOUNTER — Ambulatory Visit (HOSPITAL_BASED_OUTPATIENT_CLINIC_OR_DEPARTMENT_OTHER): Payer: No Typology Code available for payment source

## 2020-11-20 ENCOUNTER — Ambulatory Visit (HOSPITAL_COMMUNITY)
Admission: RE | Admit: 2020-11-20 | Discharge: 2020-11-20 | Disposition: A | Discharge status: Home / Self Care | Payer: No Typology Code available for payment source | Source: Ambulatory Visit | Attending: Internal Medicine | Admitting: Internal Medicine

## 2020-11-20 ENCOUNTER — Other Ambulatory Visit: Payer: Self-pay

## 2020-11-20 DIAGNOSIS — R0989 Other specified symptoms and signs involving the circulatory and respiratory systems: Secondary | ICD-10-CM | POA: Diagnosis present

## 2020-11-20 DIAGNOSIS — R079 Chest pain, unspecified: Secondary | ICD-10-CM | POA: Insufficient documentation

## 2020-11-20 LAB — MYOCARDIAL PERFUSION IMAGING
Angina Index: 0
Duke Treadmill Score: 9
Estimated workload: 10.1
Exercise duration (min): 9 min
Exercise duration (sec): 0 s
LV dias vol: 114 mL (ref 62–150)
LV sys vol: 51 mL
MPHR: 159 {beats}/min
Nuc Stress EF: 56 %
Peak HR: 148 {beats}/min
Percent HR: 93 %
Rest HR: 89 {beats}/min
Rest Nuclear Isotope Dose: 10.9 mCi
SDS: 0
SRS: 1.5
SSS: 1.5
ST Depression (mm): 0 mm
Stress Nuclear Isotope Dose: 30.9 mCi
TID: 0.91

## 2020-11-20 MED ORDER — TECHNETIUM TC 99M TETROFOSMIN IV KIT
10.9000 | PACK | Freq: Once | INTRAVENOUS | Status: AC | PRN
  Administered 2020-11-20: 10.9 via INTRAVENOUS
  Filled 2020-11-20: qty 11

## 2020-11-20 MED ORDER — TECHNETIUM TC 99M TETROFOSMIN IV KIT
30.9000 | PACK | Freq: Once | INTRAVENOUS | Status: AC | PRN
  Administered 2020-11-20: 30.9 via INTRAVENOUS
  Filled 2020-11-20: qty 31

## 2020-11-21 ENCOUNTER — Other Ambulatory Visit: Payer: No Typology Code available for payment source

## 2020-11-24 ENCOUNTER — Telehealth: Payer: Self-pay

## 2020-11-24 NOTE — Telephone Encounter (Signed)
P.A. TESTOSTERONE 30 MG 

## 2020-11-27 ENCOUNTER — Other Ambulatory Visit: Payer: No Typology Code available for payment source

## 2020-12-03 ENCOUNTER — Ambulatory Visit: Payer: No Typology Code available for payment source | Admitting: Cardiology

## 2020-12-03 NOTE — Telephone Encounter (Signed)
P.A. approved til 11/27/21, called pharmacy went thru $10, pt informed

## 2020-12-04 ENCOUNTER — Other Ambulatory Visit: Payer: Self-pay

## 2020-12-04 ENCOUNTER — Ambulatory Visit (HOSPITAL_COMMUNITY): Payer: No Typology Code available for payment source | Attending: Cardiovascular Disease

## 2020-12-04 DIAGNOSIS — R079 Chest pain, unspecified: Secondary | ICD-10-CM

## 2020-12-04 LAB — ECHOCARDIOGRAM COMPLETE
AR max vel: 2.26 cm2
AV Area VTI: 2.43 cm2
AV Area mean vel: 2.28 cm2
AV Mean grad: 8.3 mmHg
AV Peak grad: 15.5 mmHg
Ao pk vel: 1.97 m/s
Area-P 1/2: 4.46 cm2
P 1/2 time: 395 msec
S' Lateral: 3.1 cm

## 2020-12-10 ENCOUNTER — Ambulatory Visit
Admission: RE | Admit: 2020-12-10 | Discharge: 2020-12-10 | Disposition: A | Discharge status: Home / Self Care | Payer: No Typology Code available for payment source | Source: Ambulatory Visit | Attending: Cardiology | Admitting: Cardiology

## 2020-12-10 DIAGNOSIS — R9431 Abnormal electrocardiogram [ECG] [EKG]: Secondary | ICD-10-CM

## 2020-12-10 DIAGNOSIS — I10 Essential (primary) hypertension: Secondary | ICD-10-CM

## 2020-12-11 ENCOUNTER — Other Ambulatory Visit: Payer: No Typology Code available for payment source

## 2020-12-12 ENCOUNTER — Telehealth: Payer: Self-pay | Admitting: *Deleted

## 2020-12-12 MED ORDER — ROSUVASTATIN CALCIUM 40 MG PO TABS: 40.0000 mg | ORAL_TABLET | Freq: Every day | ORAL | 3 refills | Status: DC

## 2020-12-12 NOTE — Telephone Encounter (Signed)
-----   Message from Belva Crome, MD sent at 12/10/2020  8:26 PM EDT ----- Regarding: Mildly elevated coronary calcium score The coronary calcium score is mildly elevated. Needs to start Rosuvastatin 20 mg daily. Liver and lipid panel in 6 weeks.  LDL target < 70.

## 2020-12-12 NOTE — Telephone Encounter (Signed)
Spoke with pt and reviewed results and recommendations.  Pt agreeable to plan.  Scheduled pt to come in and see Dr. Tamala Julian in November to establish care and get fasting labs.  Reviewed side effects to watch for.  Pt verbalized understanding and was in agreement with plan.

## 2020-12-16 ENCOUNTER — Ambulatory Visit: Payer: No Typology Code available for payment source | Admitting: Cardiology

## 2020-12-17 NOTE — Progress Notes (Signed)
Called patient, NA, mailbox is full, cannot leave a message.

## 2020-12-17 NOTE — Progress Notes (Signed)
Patient called back, but phone hung up before I could give him the results. I tried to call back, but phone went straight to VM and no message could be left.

## 2020-12-19 NOTE — Progress Notes (Signed)
Called and spoke to pt, pt voiced understanding. Pt stated that the chest pain has been better since starting the BP medication.

## 2021-01-08 ENCOUNTER — Other Ambulatory Visit: Payer: Self-pay

## 2021-01-08 ENCOUNTER — Other Ambulatory Visit (INDEPENDENT_AMBULATORY_CARE_PROVIDER_SITE_OTHER): Payer: No Typology Code available for payment source

## 2021-01-08 DIAGNOSIS — Z23 Encounter for immunization: Secondary | ICD-10-CM | POA: Diagnosis not present

## 2021-01-13 ENCOUNTER — Other Ambulatory Visit: Payer: Self-pay | Admitting: Family Medicine

## 2021-01-13 DIAGNOSIS — E291 Testicular hypofunction: Secondary | ICD-10-CM

## 2021-01-13 NOTE — Telephone Encounter (Signed)
[facility] please advise [facility]

## 2021-01-14 NOTE — Telephone Encounter (Signed)
Sent my chart message to advise. [place]

## 2021-02-02 NOTE — Progress Notes (Signed)
Cardiology Office Note:    Date:  02/03/2021   ID:  John Clay, DOB Jul 30, 1960, MRN [id number]  PCP:  Denita Lung, MD  Cardiologist:  None   Referring MD: Denita Lung, MD   Chief Complaint  Patient presents with   Follow-up    Aortic valve disease Asymptomatic coronary disease Conduction abnormality with trifascicular block, right bundle, left anterior hemiblock, first-degree AV block Hyperlipidemia Pretension     History of Present Illness:    John Clay is a 60 y.o. male with a hx of nodular sclerosing Hodgkin's lymphoma treated with sternal mantle radiation therapy in 1983, spleenectomy, elevated CAC score, minimal carotid plaque, hypothyroidism, primary hypertension, RBBB/LAHB with 1 degree AVB,  and aortic valve calcification.   Currently asymptomatic.  Exercises regularly.  No exercise-induced symptoms.  Recent negative myocardial perfusion imaging.  Recent low risk coronary calcium score with a value of 47.  Echo did demonstrate mild aortic stenosis and regurgitation, likely a consequence of radiation therapy but could be native disease.  This needs to be followed.  In reviewing records from Dr. Virgina Jock, conduction abnormality is new.  Past Medical History:  Diagnosis Date   Allergic rhinitis    Heart palpitations    Hodgkin's lymphoma ([place]) 1983   Treated with radiation therapy   Hypothyroidism    Secondary to radiation therapy    Past Surgical History:  Procedure Laterality Date   SPLENECTOMY      Current Medications: Current Meds  Medication Sig   amLODipine (NORVASC) 5 MG tablet Take 1 tablet (5 mg total) by mouth daily.   levothyroxine (SYNTHROID) 88 MCG tablet Take 1 tablet (88 mcg total) by mouth daily.   metoprolol succinate (TOPROL-XL) 50 MG 24 hr tablet Take with or immediately following a meal.   Multiple Vitamin (MULTI-VITAMINS) TABS Take 1 tablet by mouth daily.   rosuvastatin (CRESTOR) 40 MG tablet Take 1 tablet (40  mg total) by mouth daily.   Testosterone 30 MG/ACT SOLN APPLY 1 PUMP TO EACH ARM DAILY     Allergies:   Other   Social History   Socioeconomic History   Marital status: Married    Spouse name: Not on file   Number of children: 1   Years of education: Not on file   Highest education level: Not on file  Occupational History   Not on file  Tobacco Use   Smoking status: Some Days    Types: Cigars   Smokeless tobacco: Never  Vaping Use   Vaping Use: Never used  Substance and Sexual Activity   Alcohol use: Yes    Alcohol/week: 7.0 standard drinks    Types: 7 Glasses of wine per week    Comment: occ   Drug use: No   Sexual activity: Yes  Other Topics Concern   Not on file  Social History Narrative   Not on file   Social Determinants of Health   Financial Resource Strain: Not on file  Food Insecurity: Not on file  Transportation Needs: Not on file  Physical Activity: Not on file  Stress: Not on file  Social Connections: Not on file     Family History: The patient's family history includes Healthy in his mother; Hypertension in his father; Kidney disease in his father.  ROS:   Please see the history of present illness.    Has concerns about his health.  He was fully updated on the presence of calcific aortic valve disease, conduction abnormality possibly related  to radiation and calcific aortic valve disease, asymptomatic coronary artery disease without evidence of ischemia on recent nuclear, and need for better blood pressure control with target less than 130/80 mmHg.  All other systems reviewed and are negative.  EKGs/Labs/Other Studies Reviewed:    The following studies were reviewed today: Coronary Calcium Score 12/10/2020: IMPRESSION: Total Agatston score: Goree percentile: 74   Aortic atherosclerosis.   No acute extra cardiac abnormality.  ECHOCARDIOGRAPHY 12/04/2020: IMPRESSIONS     1. Left ventricular ejection fraction, by estimation, is  60 to 65%. The  left ventricle has normal function. The left ventricle has no regional  wall motion abnormalities. Left ventricular diastolic parameters are  indeterminate.   2. Right ventricular systolic function is normal. The right ventricular  size is normal. There is normal pulmonary artery systolic pressure.   3. The mitral valve is grossly normal. Trivial mitral valve  regurgitation.   4. The aortic valve is calcified. Aortic valve regurgitation is mild to  moderate. Mild aortic valve stenosis.   BILATERAL CAROTID DOPPLER 11/20/2020: Summary:  Right Carotid: The extracranial vessels were near-normal with only minimal  wall                 thickening or plaque.   Left Carotid: The extracranial vessels were near-normal with only minimal  wall                thickening or plaque.   Vertebrals:  Bilateral vertebral arteries demonstrate antegrade flow.  Subclavians: Normal flow hemodynamics were seen in bilateral subclavian               arteries.   MYOCARDIAL PERFUSION IMAGING 11/25/2020:   Findings are consistent with no prior ischemia. The study is low risk.   No ST deviation from baseline EKG was noted.   LV perfusion is abnormal. There is no evidence of ischemia. There is no evidence of infarction.   Defect 1: There is a small defect with mild reduction in uptake present in the apical inferior location(s) that is fixed. There is normal wall motion in the defect area. Consistent with artifact caused by bowel tracer uptake and diaphragmatic attenuation.   Nuclear stress EF: 56 %. The left ventricular ejection fraction is normal (55-65%). Left ventricular function is normal. End diastolic cavity size is mildly enlarged. End systolic cavity size is mildly enlarged  EKG:  EKG  11/15/2020 revealed nsr, right bundle branch block (QRSD 138 msec), 1 degree AVB, and LAHB (trifascicular block).  Recent Labs: No results found for requested labs within last 8760 hours.  Recent Lipid  Panel    Component Value Date/Time   CHOL 195 01/10/2020 0940   TRIG 73 01/10/2020 0940   HDL 67 01/10/2020 0940   CHOLHDL 2.9 01/10/2020 0940   CHOLHDL 2.9 03/20/2016 0830   VLDL 12 03/20/2016 0830   LDLCALC 115 (H) 01/10/2020 0940    Physical Exam:    VS:  Ht 6\' 3"  (1.905 m)   BMI 29.62 kg/m     Wt Readings from Last 3 Encounters:  11/20/20 237 lb (107.5 kg)  11/15/20 237 lb 9.6 oz (107.8 kg)  11/15/20 238 lb (108 kg)     GEN: Healthy and slightly overweight.. No acute distress HEENT: Normal NECK: No JVD. LYMPHATICS: No lymphadenopathy CARDIAC: 2/6 systolic and 1/6 diastolic right upper sternal and left mid sternal murmur. RRR no gallop, or edema. VASCULAR: Normal Pulses. No bruits. RESPIRATORY:  Clear to auscultation without rales,  wheezing or rhonchi  ABDOMEN: Soft, non-tender, non-distended, No pulsatile mass, MUSCULOSKELETAL: No deformity  SKIN: Warm and dry NEUROLOGIC:  Alert and oriented x 3 PSYCHIATRIC:  Normal affect   ASSESSMENT:    1. Essential hypertension   2. Calcification of native coronary artery   3. Aortic valve calcification   4. Trifascicular block   5. Chest pain, unspecified type   6. History of Hodgkin's lymphoma   7. Radiation therapy complication, sequela    PLAN:    In order of problems listed above:  Systolic pressure is somewhat elevated.  Start losartan 25 mg/day.  We will be able to uptitrate as needed to get to the target.  Did not increase Toprol-XL dose because of conduction abnormality.  Will be having blood work at his primary care physician this week.  May still need a downstream basic metabolic panel in 1 to 2 weeks. Primary prevention discussed as noted below. Mild aortic stenosis and regurgitation due to calcific aortic valve disease.  This is potentially hastened by prior history of mantle radiation therapy for Hodgkin's.  2D Doppler echocardiogram will be performed again in 1 year to detect any significant change in valve  function. Likely related to calcific encroachment and radiation injury.  No action item at this time.  The lesion is right bundle, left anterior hemiblock, and minor first-degree AV block.  The first-degree AV block will be aggravated by increasing doses of beta-blocker and therefore we have chosen not to increase Toprol-XL. Has had vague chest pain over the years.  No ischemia documented today.  Exercising on a regular basis without recurrence of chest discomfort.  Continue to follow. Followed [facility] at [facility].  Overall education and awareness concerning primary risk prevention was discussed in detail: LDL less than 70, hemoglobin A1c less than 7, blood pressure target less than 130/80 mmHg, >150 minutes of moderate aerobic activity per week, avoidance of smoking, weight control (via diet and exercise), and continued surveillance/management of/for obstructive sleep apnea.   NOTE: Mr. Ninfa Linden should have a lipid panel, comprehensive metabolic panel, LP(a), performed at the next blood draw.  This will assess his lipids and liver function on rosuvastatin.  Medication Adjustments/Labs and Tests Ordered: Current medicines are reviewed at length with the patient today.  Concerns regarding medicines are outlined above.  No orders of the defined types were placed in this encounter.  No orders of the defined types were placed in this encounter.   There are no Patient Instructions on file for this visit.   Signed, Sinclair Grooms, MD  02/03/2021 9:15 AM    [facility]

## 2021-02-03 ENCOUNTER — Ambulatory Visit: Payer: No Typology Code available for payment source | Admitting: Interventional Cardiology

## 2021-02-03 ENCOUNTER — Encounter: Payer: Self-pay | Admitting: Interventional Cardiology

## 2021-02-03 ENCOUNTER — Other Ambulatory Visit: Payer: Self-pay

## 2021-02-03 VITALS — BP 142/82 | HR 51 | Ht 75.0 in | Wt 224.4 lb

## 2021-02-03 DIAGNOSIS — I2510 Atherosclerotic heart disease of native coronary artery without angina pectoris: Secondary | ICD-10-CM

## 2021-02-03 DIAGNOSIS — I2584 Coronary atherosclerosis due to calcified coronary lesion: Secondary | ICD-10-CM

## 2021-02-03 DIAGNOSIS — I453 Trifascicular block: Secondary | ICD-10-CM

## 2021-02-03 DIAGNOSIS — I10 Essential (primary) hypertension: Secondary | ICD-10-CM | POA: Diagnosis not present

## 2021-02-03 DIAGNOSIS — Z8571 Personal history of Hodgkin lymphoma: Secondary | ICD-10-CM

## 2021-02-03 DIAGNOSIS — I359 Nonrheumatic aortic valve disorder, unspecified: Secondary | ICD-10-CM | POA: Diagnosis not present

## 2021-02-03 DIAGNOSIS — T66XXXS Radiation sickness, unspecified, sequela: Secondary | ICD-10-CM

## 2021-02-03 DIAGNOSIS — R079 Chest pain, unspecified: Secondary | ICD-10-CM

## 2021-02-03 MED ORDER — LOSARTAN POTASSIUM 25 MG PO TABS: 25.0000 mg | ORAL_TABLET | Freq: Every day | ORAL | 3 refills | Status: DC

## 2021-02-03 NOTE — Patient Instructions (Signed)
Medication Instructions:  1) START Losartan 25mg  once daily  *If you need a refill on your cardiac medications before your next appointment, please call your pharmacy*   Lab Work: BMET, Lipid and Lipoprotein A (Lpa) with your Primary Care on Wednesday.  If they do not draw, please let me know and we will schedule you to have it drawn here in 1 month.  If you have labs (blood work) drawn today and your tests are completely normal, you will receive your results only by: [place] (if you have MyChart) OR A paper copy in the mail If you have any lab test that is abnormal or we need to change your treatment, we will call you to review the results.   Testing/Procedures: Your physician has requested that you have an echocardiogram 1-2 weeks prior to seeing Dr. Tamala Julian back in 1 year. Echocardiography is a painless test that uses sound waves to create images of your heart. It provides your doctor with information about the size and shape of your heart and how well your heart's chambers and valves are working. This procedure takes approximately one hour. There are no restrictions for this procedure.    Follow-Up:  Your physician recommends that you schedule a follow-up appointment in: 4-6 weeks with the Pharmacy Team in our Hypertension Clinic.  At [facility], you and your health needs are our priority.  As part of our continuing mission to provide you with exceptional heart care, we have created designated Provider Care Teams.  These Care Teams include your primary Cardiologist (physician) and Advanced Practice Providers (APPs -  Physician Assistants and Nurse Practitioners) who all work together to provide you with the care you need, when you need it.  We recommend signing up for the patient portal called "MyChart".  Sign up information is provided on this After Visit Summary.  MyChart is used to connect with patients for Virtual Visits (Telemedicine).  Patients are able to view lab/test  results, encounter notes, upcoming appointments, etc.  Non-urgent messages can be sent to your provider as well.   To learn more about what you can do with MyChart, go to [email or web address].    Your next appointment:   1 year(s)  The format for your next appointment:   In Person  Provider:   Farrel Gordon, MD    Other Instructions

## 2021-02-05 ENCOUNTER — Other Ambulatory Visit: Payer: Self-pay

## 2021-02-05 ENCOUNTER — Ambulatory Visit: Payer: No Typology Code available for payment source | Admitting: Family Medicine

## 2021-02-05 ENCOUNTER — Encounter: Payer: Self-pay | Admitting: Family Medicine

## 2021-02-05 VITALS — BP 128/82 | HR 80 | Temp 97.5°F | Wt 231.2 lb

## 2021-02-05 DIAGNOSIS — I350 Nonrheumatic aortic (valve) stenosis: Secondary | ICD-10-CM

## 2021-02-05 DIAGNOSIS — E039 Hypothyroidism, unspecified: Secondary | ICD-10-CM | POA: Diagnosis not present

## 2021-02-05 DIAGNOSIS — E291 Testicular hypofunction: Secondary | ICD-10-CM | POA: Diagnosis not present

## 2021-02-05 DIAGNOSIS — Z125 Encounter for screening for malignant neoplasm of prostate: Secondary | ICD-10-CM

## 2021-02-05 DIAGNOSIS — Z8571 Personal history of Hodgkin lymphoma: Secondary | ICD-10-CM

## 2021-02-05 DIAGNOSIS — I10 Essential (primary) hypertension: Secondary | ICD-10-CM

## 2021-02-05 DIAGNOSIS — I453 Trifascicular block: Secondary | ICD-10-CM

## 2021-02-05 DIAGNOSIS — D126 Benign neoplasm of colon, unspecified: Secondary | ICD-10-CM

## 2021-02-05 NOTE — Progress Notes (Signed)
   Subjective:    Patient ID: John Clay, male    DOB: 13-Aug-1960, 60 y.o.   MRN: [id number]  HPI He is here for an interval evaluation.  He was seen recently by cardiology and does have evidence of trifascicular block as well as aortic stenosis.  Losartan was added to his drug regimen.  He also continues on his testosterone and is doing well on that.  Rosuvastatin is causing no difficulty.  He continues on his thyroid medication.  He does have a previous history of Hodgkin's lymphoma and apparently is seen at [facility] once per year for routine follow-up.  He does have a history of tubular adenoma and is followed for routine follow-up concerning that.  He has no particular concerns or complaints.  Immunizations were reviewed.   Review of Systems     Objective:   Physical Exam Alert and in no distress. Tympanic membranes and canals are normal. Pharyngeal area is normal. Neck is supple without adenopathy or thyromegaly. Cardiac exam shows a 1/6 SEM rhythm without murmurs or gallops. Lungs are clear to auscultation.        Assessment & Plan:  Essential hypertension - Plan: CBC with Differential/Platelet, Comprehensive metabolic panel  Acquired hypothyroidism - Plan: TSH  History of Hodgkin's lymphoma - Plan: CBC with Differential/Platelet, Comprehensive metabolic panel  Hypogonadism in male - Plan: Testosterone  Tubular adenoma of colon  Nonrheumatic aortic valve stenosis  Trifascicular block - Plan: Lipid panel  Screening for prostate cancer - Plan: PSA Follow-up with gastroenterology as planned.  We will get another echocardiogram concerning his aortic stenosis.

## 2021-02-06 LAB — PSA: Prostate Specific Ag, Serum: 0.6 ng/mL (ref 0.0–4.0)

## 2021-02-06 LAB — CBC WITH DIFFERENTIAL/PLATELET
Basophils Absolute: 0.1 10*3/uL (ref 0.0–0.2)
Basos: 1 %
EOS (ABSOLUTE): 0.2 10*3/uL (ref 0.0–0.4)
Eos: 3 %
Hematocrit: 47.1 % (ref 37.5–51.0)
Hemoglobin: 15.2 g/dL (ref 13.0–17.7)
Immature Grans (Abs): 0 10*3/uL (ref 0.0–0.1)
Immature Granulocytes: 0 %
Lymphocytes Absolute: 1.7 10*3/uL (ref 0.7–3.1)
Lymphs: 25 %
MCH: 24.3 pg — ABNORMAL LOW (ref 26.6–33.0)
MCHC: 32.3 g/dL (ref 31.5–35.7)
MCV: 75 fL — ABNORMAL LOW (ref 79–97)
Monocytes Absolute: 0.6 10*3/uL (ref 0.1–0.9)
Monocytes: 8 %
Neutrophils Absolute: 4.5 10*3/uL (ref 1.4–7.0)
Neutrophils: 63 %
Platelets: 204 10*3/uL (ref 150–450)
RBC: 6.26 x10E6/uL — ABNORMAL HIGH (ref 4.14–5.80)
RDW: 15.2 % (ref 11.6–15.4)
WBC: 7.1 10*3/uL (ref 3.4–10.8)

## 2021-02-06 LAB — COMPREHENSIVE METABOLIC PANEL
ALT: 100 IU/L — ABNORMAL HIGH (ref 0–44)
AST: 61 IU/L — ABNORMAL HIGH (ref 0–40)
Albumin/Globulin Ratio: 1.9 (ref 1.2–2.2)
Albumin: 4.7 g/dL (ref 3.8–4.8)
Alkaline Phosphatase: 75 IU/L (ref 44–121)
BUN/Creatinine Ratio: 12 (ref 10–24)
BUN: 13 mg/dL (ref 8–27)
Bilirubin Total: 0.5 mg/dL (ref 0.0–1.2)
CO2: 22 mmol/L (ref 20–29)
Calcium: 9.3 mg/dL (ref 8.6–10.2)
Chloride: 100 mmol/L (ref 96–106)
Creatinine, Ser: 1.1 mg/dL (ref 0.76–1.27)
Globulin, Total: 2.5 g/dL (ref 1.5–4.5)
Glucose: 78 mg/dL (ref 70–99)
Potassium: 4.3 mmol/L (ref 3.5–5.2)
Sodium: 138 mmol/L (ref 134–144)
Total Protein: 7.2 g/dL (ref 6.0–8.5)
eGFR: 76 mL/min/{1.73_m2} (ref >59)

## 2021-02-06 LAB — LIPID PANEL
Chol/HDL Ratio: 1.7 ratio (ref 0.0–5.0)
Cholesterol, Total: 97 mg/dL — ABNORMAL LOW (ref 100–199)
HDL: 56 mg/dL (ref >39)
LDL Chol Calc (NIH): 28 mg/dL (ref 0–99)
Triglycerides: 51 mg/dL (ref 0–149)
VLDL Cholesterol Cal: 13 mg/dL (ref 5–40)

## 2021-02-06 LAB — TSH: TSH: 1.45 u[IU]/mL (ref 0.450–4.500)

## 2021-02-06 LAB — TESTOSTERONE: Testosterone: 180 ng/dL — ABNORMAL LOW (ref 264–916)

## 2021-02-06 NOTE — Addendum Note (Signed)
Addended by: Denita Lung on: 02/06/2021 09:47 AM   Modules accepted: Orders

## 2021-02-09 ENCOUNTER — Other Ambulatory Visit: Payer: Self-pay | Admitting: Family Medicine

## 2021-02-09 DIAGNOSIS — E039 Hypothyroidism, unspecified: Secondary | ICD-10-CM

## 2021-02-10 ENCOUNTER — Other Ambulatory Visit: Payer: Self-pay | Admitting: Cardiology

## 2021-02-10 DIAGNOSIS — I10 Essential (primary) hypertension: Secondary | ICD-10-CM

## 2021-02-11 ENCOUNTER — Other Ambulatory Visit: Payer: Self-pay

## 2021-02-11 ENCOUNTER — Telehealth: Payer: No Typology Code available for payment source | Admitting: Family Medicine

## 2021-02-11 VITALS — BP 142/82 | Wt 231.0 lb

## 2021-02-11 DIAGNOSIS — J209 Acute bronchitis, unspecified: Secondary | ICD-10-CM | POA: Diagnosis not present

## 2021-02-11 MED ORDER — AMOXICILLIN 875 MG PO TABS: 875.0000 mg | ORAL_TABLET | Freq: Two times a day (BID) | ORAL | 0 refills | Status: DC

## 2021-02-11 NOTE — Progress Notes (Signed)
   Subjective:    Patient ID: John Clay, male    DOB: 08/28/1960, 60 y.o.   MRN: [id number]  HPI Documentation for virtual audio and video telecommunications through [place] encounter: The patient was located at home. 2 patient identifiers used.  The provider was located in the office. The patient did consent to this visit and is aware of possible charges through their insurance for this visit. The other persons participating in this telemedicine service were none. Time spent on call was 5 minutes and in review of previous records >17 minutes total for counseling and coordination of care. This virtual service is not related to other E/M service within previous 7 days.  He complains of over a month history of nasal and chest congestion but no fever, chills, earache, sore throat.  He did have leftover Amoxil at the beginning of the month and took 5 days worth and states that he thought he was back to normal but it has reoccurred.  Review of Systems     Objective:   Physical Exam Alert and in no distress otherwise not examined       Assessment & Plan:   Acute bronchitis, unspecified organism - Plan: amoxicillin (AMOXIL) 875 MG tablet I explained that I think what happened was he can accurately treated a bacterial infection.  I will give him 10 days worth.  He is to take all 10 days worth and if still having difficulty, call me.  He was comfortable with that.

## 2021-02-17 ENCOUNTER — Other Ambulatory Visit: Payer: Self-pay | Admitting: Family Medicine

## 2021-02-17 DIAGNOSIS — E291 Testicular hypofunction: Secondary | ICD-10-CM

## 2021-02-17 NOTE — Telephone Encounter (Signed)
[facility] is requesting to fill pt testosterone. Please advise KH °

## 2021-03-03 ENCOUNTER — Other Ambulatory Visit: Payer: Self-pay

## 2021-03-03 ENCOUNTER — Ambulatory Visit: Payer: No Typology Code available for payment source | Admitting: Pharmacist

## 2021-03-03 VITALS — BP 148/78 | HR 91

## 2021-03-03 DIAGNOSIS — E782 Mixed hyperlipidemia: Secondary | ICD-10-CM

## 2021-03-03 DIAGNOSIS — I10 Essential (primary) hypertension: Secondary | ICD-10-CM

## 2021-03-03 DIAGNOSIS — I2584 Coronary atherosclerosis due to calcified coronary lesion: Secondary | ICD-10-CM

## 2021-03-03 DIAGNOSIS — I2510 Atherosclerotic heart disease of native coronary artery without angina pectoris: Secondary | ICD-10-CM | POA: Diagnosis not present

## 2021-03-03 DIAGNOSIS — E785 Hyperlipidemia, unspecified: Secondary | ICD-10-CM | POA: Insufficient documentation

## 2021-03-03 MED ORDER — AMLODIPINE BESYLATE 10 MG PO TABS: 10.0000 mg | ORAL_TABLET | Freq: Every day | ORAL | 3 refills | Status: DC

## 2021-03-03 NOTE — Progress Notes (Signed)
Patient ID: John Clay                 DOB: 1960/07/07                      MRN: [id number]     HPI: John Clay is a 60 y.o. male referred by Dr. Tamala Julian to HTN clinic. PMH is significant for HTN, coronary calcium score of 47, aortic valve calcification, RBBB/LAHB with 1 degree AVB, nodular sclerosing Hodgkin's lymphoma treated with sternal mantle radiation in 1983 with secondary hypothyroidism, and splenectomy. Nuclear stress test 11/2020 was low risk with EF 56%. Pt last seen by Dr Tamala Julian 02/03/21. BP was elevated at 142/82, HR slightly low at 51. He was started on losartan 25mg  daily. F/u visit with PCP 2 days later showed improved BP at 128/82. He had CMET and lipid panel drawn at that time as well.  Pt presents today for follow up. Reports tolerating his meds well including new start losartan. Denies dizziness, headache, blurred vision, and LE edema. Reports eating more sodium in his diet recently. Also had more alcohol before lab check last month when LFTs were elevated. Has a wrist cuff at home. Reports systolic has improved down to the 140s at home and diastolic has improved down to the 60s.  Current HTN meds: amlodipine 5mg  daily, losartan 25mg  daily, Toprol 50mg  daily  BP goal: <130/80mmHg  Family History: Hypertension in his father; Kidney disease in his father.  Social History: Occasional wine and cigar use, denies drug use.  Diet: 1 cup of coffee a day. More salt yesterday - bacon  Exercise: Has a trainer - goes to the gym twice a week. Cardio and weights.  Wt Readings from Last 3 Encounters:  02/11/21 231 lb (104.8 kg)  02/05/21 231 lb 3.2 oz (104.9 kg)  02/03/21 224 lb 6.4 oz (101.8 kg)   BP Readings from Last 3 Encounters:  02/11/21 (!) 142/82  02/05/21 128/82  02/03/21 (!) 142/82   Pulse Readings from Last 3 Encounters:  02/05/21 80  02/03/21 (!) 51  11/15/20 83    Renal function: CrCl cannot be calculated (Patient's most recent lab result is older  than the maximum 21 days allowed.).  Past Medical History:  Diagnosis Date   Allergic rhinitis    Heart palpitations    Hodgkin's lymphoma ([place]) 1983   Treated with radiation therapy   Hypothyroidism    Secondary to radiation therapy    Current Outpatient Medications on File Prior to Visit  Medication Sig Dispense Refill   amLODipine (NORVASC) 5 MG tablet TAKE 1 TABLET (5 MG TOTAL) BY MOUTH DAILY. 90 tablet 1   amoxicillin (AMOXIL) 875 MG tablet Take 1 tablet (875 mg total) by mouth 2 (two) times daily. 20 tablet 0   levothyroxine (SYNTHROID) 88 MCG tablet TAKE 1 TABLET BY MOUTH EVERY DAY 90 tablet 1   losartan (COZAAR) 25 MG tablet Take 1 tablet (25 mg total) by mouth daily. 90 tablet 3   metoprolol succinate (TOPROL-XL) 50 MG 24 hr tablet Take with or immediately following a meal. 90 tablet 3   Multiple Vitamin (MULTI-VITAMINS) TABS Take 1 tablet by mouth daily.     rosuvastatin (CRESTOR) 40 MG tablet Take 1 tablet (40 mg total) by mouth daily. 90 tablet 3   Testosterone 30 MG/ACT SOLN APPLY 1 PUMP TO EACH ARM DAILY 90 mL 4   No current facility-administered medications on file prior to visit.  Allergies  Allergen Reactions   Other Other (See Comments)    ALLERGEN: Chlorpheniramine-phenylpropan  REACTIONS: tchy eyes, sneezing, stuffiness     Assessment/Plan:  1. Hypertension - BP remains elevated above goal < 130/78mmHg. Will increase amlodipine from 5mg  to 10mg  daily. Checking renal function and electrolytes today with ARB start last month. This was checked at PCP but only 2 days after med start, was stable at that time. Pt encouraged to reduce intake of sodium and alcohol as well. He will monitor and record home BP using his wrist cuff and I'll call him in a few weeks to discuss readings. If needed, would continue with losartan titration at that time and would need f/u scheduled in clinic. Did discuss that his testosterone can contribute to elevated BP as well.  2.  Hyperlipidemia - LDL improved notably from 115 to 28 after starting rosuvastatin 40mg  daily. LFTs did increase by 3-fold, although pt reports more alcohol before lab draw as well as this past weekend. No Tylenol use. Rechecking today along with Lp(a) per Dr Tamala Julian. Encouraged pt to decrease alcohol intake especially over the next week - his PCP is also checking CMET in another week along with testosterone level.  John Clay, PharmD, BCACP, [place] [address], [place], [place] [address] Phone: [phone]; Fax: [phone] 03/03/2021 9:05 AM

## 2021-03-03 NOTE — Patient Instructions (Addendum)
It was nice to see you today!  Your blood pressure goal is < 130/44mmHg  Increase your amlodipine from 5mg  to 10mg  daily  Limit your daily sodium intake to < 2,000mg  daily  Cut back on alcohol - this can raise your blood pressure and your liver enzymes   Monitor and record your blood pressure at home. I'll call in a few weeks to see how your readings are looking

## 2021-03-04 LAB — COMPREHENSIVE METABOLIC PANEL
ALT: 53 IU/L — ABNORMAL HIGH (ref 0–44)
AST: 47 IU/L — ABNORMAL HIGH (ref 0–40)
Albumin/Globulin Ratio: 1.9 (ref 1.2–2.2)
Albumin: 4.3 g/dL (ref 3.8–4.8)
Alkaline Phosphatase: 61 IU/L (ref 44–121)
BUN/Creatinine Ratio: 11 (ref 10–24)
BUN: 13 mg/dL (ref 8–27)
Bilirubin Total: 0.4 mg/dL (ref 0.0–1.2)
CO2: 25 mmol/L (ref 20–29)
Calcium: 8.9 mg/dL (ref 8.6–10.2)
Chloride: 104 mmol/L (ref 96–106)
Creatinine, Ser: 1.14 mg/dL (ref 0.76–1.27)
Globulin, Total: 2.3 g/dL (ref 1.5–4.5)
Glucose: 96 mg/dL (ref 70–99)
Potassium: 4.5 mmol/L (ref 3.5–5.2)
Sodium: 142 mmol/L (ref 134–144)
Total Protein: 6.6 g/dL (ref 6.0–8.5)
eGFR: 73 mL/min/{1.73_m2} (ref >59)

## 2021-03-04 LAB — LIPOPROTEIN A (LPA): Lipoprotein (a): 110 nmol/L — ABNORMAL HIGH (ref <75.0)

## 2021-03-11 ENCOUNTER — Other Ambulatory Visit: Payer: No Typology Code available for payment source

## 2021-03-12 ENCOUNTER — Other Ambulatory Visit: Payer: No Typology Code available for payment source

## 2021-03-12 ENCOUNTER — Other Ambulatory Visit: Payer: Self-pay

## 2021-03-12 DIAGNOSIS — E291 Testicular hypofunction: Secondary | ICD-10-CM

## 2021-03-13 LAB — COMPREHENSIVE METABOLIC PANEL
ALT: 51 IU/L — ABNORMAL HIGH (ref 0–44)
AST: 47 IU/L — ABNORMAL HIGH (ref 0–40)
Albumin/Globulin Ratio: 1.9 (ref 1.2–2.2)
Albumin: 4.6 g/dL (ref 3.8–4.8)
Alkaline Phosphatase: 66 IU/L (ref 44–121)
BUN/Creatinine Ratio: 15 (ref 10–24)
BUN: 20 mg/dL (ref 8–27)
Bilirubin Total: 0.4 mg/dL (ref 0.0–1.2)
CO2: 26 mmol/L (ref 20–29)
Calcium: 9.4 mg/dL (ref 8.6–10.2)
Chloride: 102 mmol/L (ref 96–106)
Creatinine, Ser: 1.31 mg/dL — ABNORMAL HIGH (ref 0.76–1.27)
Globulin, Total: 2.4 g/dL (ref 1.5–4.5)
Glucose: 93 mg/dL (ref 70–99)
Potassium: 4.9 mmol/L (ref 3.5–5.2)
Sodium: 141 mmol/L (ref 134–144)
Total Protein: 7 g/dL (ref 6.0–8.5)
eGFR: 62 mL/min/{1.73_m2} (ref >59)

## 2021-03-13 LAB — TESTOSTERONE: Testosterone: 609 ng/dL (ref 264–916)

## 2021-03-25 ENCOUNTER — Encounter: Payer: Self-pay | Admitting: Pharmacist

## 2021-03-25 NOTE — Telephone Encounter (Signed)
This encounter was created in error - please disregard.

## 2021-03-26 ENCOUNTER — Telehealth: Payer: Self-pay | Admitting: Pharmacist

## 2021-03-26 NOTE — Telephone Encounter (Signed)
Called pt to follow up with home BP readings since increasing amlodipine to 10mg  daily. Denies ankle swelling and dizziness, tolerating meds well.  Home readings: 12/13: 129/69, 121/66. HR 92 12/19: 120/66. HR 89 12/23: 132/82, 136/67. HR 94, 89 1/4: 113/59, 124/66, 130/66. HR 88, 90  Pt aware to continue on current meds.

## 2021-03-30 ENCOUNTER — Other Ambulatory Visit: Payer: Self-pay | Admitting: Family Medicine

## 2021-03-30 DIAGNOSIS — I10 Essential (primary) hypertension: Secondary | ICD-10-CM

## 2021-08-11 ENCOUNTER — Other Ambulatory Visit: Payer: Self-pay | Admitting: Family Medicine

## 2021-08-11 ENCOUNTER — Ambulatory Visit: Payer: No Typology Code available for payment source | Admitting: Family Medicine

## 2021-08-11 ENCOUNTER — Encounter: Payer: Self-pay | Admitting: Family Medicine

## 2021-08-11 VITALS — BP 134/72 | HR 75 | Temp 97.7°F | Ht 74.0 in | Wt 229.4 lb

## 2021-08-11 DIAGNOSIS — I350 Nonrheumatic aortic (valve) stenosis: Secondary | ICD-10-CM

## 2021-08-11 DIAGNOSIS — I10 Essential (primary) hypertension: Secondary | ICD-10-CM

## 2021-08-11 DIAGNOSIS — G4733 Obstructive sleep apnea (adult) (pediatric): Secondary | ICD-10-CM

## 2021-08-11 DIAGNOSIS — Z7189 Other specified counseling: Secondary | ICD-10-CM

## 2021-08-11 DIAGNOSIS — Z0000 Encounter for general adult medical examination without abnormal findings: Secondary | ICD-10-CM | POA: Diagnosis not present

## 2021-08-11 DIAGNOSIS — E291 Testicular hypofunction: Secondary | ICD-10-CM

## 2021-08-11 DIAGNOSIS — E039 Hypothyroidism, unspecified: Secondary | ICD-10-CM

## 2021-08-11 DIAGNOSIS — I453 Trifascicular block: Secondary | ICD-10-CM

## 2021-08-11 DIAGNOSIS — Z8571 Personal history of Hodgkin lymphoma: Secondary | ICD-10-CM

## 2021-08-11 DIAGNOSIS — Z125 Encounter for screening for malignant neoplasm of prostate: Secondary | ICD-10-CM

## 2021-08-11 DIAGNOSIS — I351 Nonrheumatic aortic (valve) insufficiency: Secondary | ICD-10-CM

## 2021-08-11 DIAGNOSIS — E782 Mixed hyperlipidemia: Secondary | ICD-10-CM

## 2021-08-11 DIAGNOSIS — D126 Benign neoplasm of colon, unspecified: Secondary | ICD-10-CM

## 2021-08-11 DIAGNOSIS — J301 Allergic rhinitis due to pollen: Secondary | ICD-10-CM

## 2021-08-11 MED ORDER — LEVOTHYROXINE SODIUM 88 MCG PO TABS: 88.0000 ug | ORAL_TABLET | Freq: Every day | ORAL | 3 refills | Status: DC

## 2021-08-11 MED ORDER — ROSUVASTATIN CALCIUM 40 MG PO TABS: 40.0000 mg | ORAL_TABLET | Freq: Every day | ORAL | 3 refills | Status: DC

## 2021-08-11 MED ORDER — AMLODIPINE BESYLATE 10 MG PO TABS: 10.0000 mg | ORAL_TABLET | Freq: Every day | ORAL | 3 refills | Status: DC

## 2021-08-11 MED ORDER — LOSARTAN POTASSIUM 25 MG PO TABS: 25.0000 mg | ORAL_TABLET | Freq: Every day | ORAL | 3 refills | Status: DC

## 2021-08-11 NOTE — Progress Notes (Signed)
Complete physical exam  Patient: John Clay   DOB: Aug 15, 1960   61 y.o. Male  MRN: [id number]  Subjective:      John Clay is a 61 y.o. male who presents today for a complete physical exam. He reports consuming a general diet. Gym/ health club routine includes cardio, mod to heavy weightlifting, treadmill, and walking on track . He generally feels well. He reports sleeping fairly well. He has been dealing with the death of a very close friend.  Has had a profound effect on him psychologically.  He also notes occasional difficulty with blood in the stool.  He did have a colonoscopy in 2020 which did show adenomatous colonic polyp.  Apparently he had difficulty with blood in his stool at that time as well.  He does note that prior to the blood in his stool this time he did have to strain.  He has had no rectal pain burning, stinging, vomiting or diarrhea.  Otherwise he does have OSA but is not been using the CPAP due to his own not getting the machine clean.  Does have a previous history of Hodgkin's lymphoma and subsequent hypothyroid. continues on amlodipine as well as metoprolol.  He is also using testosterone on a regular basis.  Continues on rosuvastatin.  He does follow-up with cardiology.  He does have a history of AS and MVP but no chest pain, shortness of breath, PND or DOE.  Otherwise his family and social history as well as health maintenance and immunizations was reviewed   Most recent fall risk assessment:     View : No data to display.           Most recent depression screenings:    08/11/2021    1:45 PM 02/11/2021    8:19 AM  PHQ 2/9 Scores  PHQ - 2 Score 3 0  PHQ- 9 Score 3       Patient Active Problem List   Diagnosis Date Noted   Hyperlipidemia 03/03/2021   Nonrheumatic aortic valve stenosis 02/05/2021   Trifascicular block 02/05/2021   Abnormal EKG 11/15/2020   Bruit of left carotid artery 11/15/2020   Tubular adenoma of colon 03/15/2019   MVP  (mitral valve prolapse) 04/26/2017   Hypogonadism in male 04/26/2017   Acquired hypothyroidism 04/26/2017   Seasonal allergic rhinitis due to pollen 04/26/2017   History of Hodgkin's lymphoma 02/19/2016   Essential hypertension 02/19/2016   OSA (obstructive sleep apnea) 12/15/2012   Past Medical History:  Diagnosis Date   Allergic rhinitis    Heart palpitations    Hodgkin's lymphoma ([place]) 1983   Treated with radiation therapy   Hypothyroidism    Secondary to radiation therapy   Past Surgical History:  Procedure Laterality Date   SPLENECTOMY     Social History   Tobacco Use   Smoking status: Some Days    Types: Cigars   Smokeless tobacco: Never  Vaping Use   Vaping Use: Never used  Substance Use Topics   Alcohol use: Not Currently    Alcohol/week: 4.0 standard drinks    Types: 4 Shots of liquor per week    Comment: occ   Drug use: No   Family History  Problem Relation Age of Onset   Healthy Mother    Kidney disease Father    Hypertension Father    Allergies  Allergen Reactions   Other Other (See Comments)    ALLERGEN: Chlorpheniramine-phenylpropan  REACTIONS: tchy eyes, sneezing, stuffiness  Patient Care Team: Denita Lung, MD as PCP - General (Family Medicine)   Outpatient Medications Prior to Visit  Medication Sig   metoprolol succinate (TOPROL-XL) 50 MG 24 hr tablet TAKE 1 TABLET BY MOUTH DAILY WITH OR IMMEDIATELY FOLLOWING A MEAL.   Multiple Vitamin (MULTI-VITAMINS) TABS Take 1 tablet by mouth daily.   Testosterone 30 MG/ACT SOLN APPLY 1 PUMP TO EACH ARM DAILY   [DISCONTINUED] amLODipine (NORVASC) 10 MG tablet Take 1 tablet (10 mg total) by mouth daily.   [DISCONTINUED] levothyroxine (SYNTHROID) 88 MCG tablet TAKE 1 TABLET BY MOUTH EVERY DAY   [DISCONTINUED] losartan (COZAAR) 25 MG tablet Take 1 tablet (25 mg total) by mouth daily.   [DISCONTINUED] rosuvastatin (CRESTOR) 40 MG tablet Take 1 tablet (40 mg total) by mouth daily.   No  facility-administered medications prior to visit.    Review of Systems  All other systems reviewed and are negative.        Objective:     BP 134/72   Pulse 75   Temp 97.7 F (36.5 C)   Ht 6' 2" (1.88 m)   Wt 229 lb 6.4 oz (104.1 kg)   SpO2 99%   BMI 29.45 kg/m  BP Readings from Last 3 Encounters:  08/11/21 134/72  03/03/21 (!) 148/78  02/11/21 (!) 142/82      Physical Exam   Alert and in no distress. Tympanic membranes and canals are normal. Pharyngeal area is normal. Neck is supple without adenopathy or thyromegaly. Cardiac exam shows a regular sinus rhythm a grade 2 systolic and grade 1 diastolic murmur, no gallops. Lungs are clear to auscultation. Rectal exam shows no external lesions.  Digital rectal exam was guaiac negative. Last CBC Lab Results  Component Value Date   WBC 7.1 02/05/2021   HGB 15.2 02/05/2021   HCT 47.1 02/05/2021   MCV 75 (L) 02/05/2021   MCH 24.3 (L) 02/05/2021   RDW 15.2 02/05/2021   PLT 204 24/46/2863   Last metabolic panel Lab Results  Component Value Date   GLUCOSE 93 03/12/2021   NA 141 03/12/2021   K 4.9 03/12/2021   CL 102 03/12/2021   CO2 26 03/12/2021   BUN 20 03/12/2021   CREATININE 1.31 (H) 03/12/2021   EGFR 62 03/12/2021   CALCIUM 9.4 03/12/2021   PROT 7.0 03/12/2021   ALBUMIN 4.6 03/12/2021   LABGLOB 2.4 03/12/2021   AGRATIO 1.9 03/12/2021   BILITOT 0.4 03/12/2021   ALKPHOS 66 03/12/2021   AST 47 (H) 03/12/2021   ALT 51 (H) 03/12/2021   Last lipids Lab Results  Component Value Date   CHOL 97 (L) 02/05/2021   HDL 56 02/05/2021   LDLCALC 28 02/05/2021   TRIG 51 02/05/2021   CHOLHDL 1.7 02/05/2021   Last thyroid functions Lab Results  Component Value Date   TSH 1.450 02/05/2021        Assessment & Plan:    Routine Health Maintenance and Physical Exam  Immunization History  Administered Date(s) Administered   Influenza Split 01/06/2012, 12/20/2012   Influenza, Quadrivalent, Recombinant, Inj, Pf  12/07/2017   Influenza,inj,Quad PF,6+ Mos 12/12/2019, 01/08/2021   Influenza-Unspecified 11/30/2016, 12/23/2016, 12/07/2017, 12/05/2018   PFIZER Comirnaty(Gray Top)Covid-19 Tri-Sucrose Vaccine 07/03/2020   PFIZER(Purple Top)SARS-COV-2 Vaccination 05/22/2019, 06/14/2019, 12/12/2019   Pfizer Covid-19 Vaccine Bivalent Booster 55yr & up 01/08/2021   Pneumococcal Conjugate-13 06/28/2017   Pneumococcal Polysaccharide-23 09/12/2018   Tdap 12/24/2017   Zoster Recombinat (Shingrix) 01/10/2020, 04/26/2020    Health Maintenance  Topic  Date Due   HIV Screening  02/05/2022 (Originally 10/28/1975)   INFLUENZA VACCINE  10/21/2021   TETANUS/TDAP  12/25/2027   COLONOSCOPY (Pts 45-19yr Insurance coverage will need to be confirmed)  03/05/2029   COVID-19 Vaccine  Completed   Hepatitis C Screening  Completed   Zoster Vaccines- Shingrix  Completed   HPV VACCINES  Aged Out    Discussed health benefits of physical activity, and encouraged him to engage in regular exercise appropriate for his age and condition.  Problem List Items Addressed This Visit     Acquired hypothyroidism   Relevant Medications   levothyroxine (SYNTHROID) 88 MCG tablet   Other Relevant Orders   TSH   Essential hypertension   Relevant Medications   amLODipine (NORVASC) 10 MG tablet   losartan (COZAAR) 25 MG tablet   rosuvastatin (CRESTOR) 40 MG tablet   Other Relevant Orders   CBC with Differential/Platelet   Comprehensive metabolic panel   History of Hodgkin's lymphoma   Hyperlipidemia   Relevant Medications   amLODipine (NORVASC) 10 MG tablet   losartan (COZAAR) 25 MG tablet   rosuvastatin (CRESTOR) 40 MG tablet   Other Relevant Orders   Lipid panel   Hypogonadism in male   Relevant Orders   Testosterone   Nonrheumatic aortic valve stenosis   Relevant Medications   amLODipine (NORVASC) 10 MG tablet   losartan (COZAAR) 25 MG tablet   rosuvastatin (CRESTOR) 40 MG tablet   OSA (obstructive sleep apnea)    Seasonal allergic rhinitis due to pollen   Trifascicular block   Relevant Medications   amLODipine (NORVASC) 10 MG tablet   losartan (COZAAR) 25 MG tablet   rosuvastatin (CRESTOR) 40 MG tablet   Tubular adenoma of colon   Other Visit Diagnoses     Routine general medical examination at a health care facility    -  Primary   Relevant Orders   CBC with Differential/Platelet   Comprehensive metabolic panel   Lipid panel   Screening for prostate cancer       Relevant Orders   PSA   Aortic valve insufficiency, etiology of cardiac valve disease unspecified       Relevant Medications   amLODipine (NORVASC) 10 MG tablet   losartan (COZAAR) 25 MG tablet   rosuvastatin (CRESTOR) 40 MG tablet   Bereavement counseling         He will continue to follow-up with cardiology.  Encouraged him to get the CPAP machine cleaned.  Continue on his present medications.  Discussed the rectal bleeding with him and explained that this is probably because he strained prior to a BM.  If this continues he will call me for further evaluation.  I then discussed the recent death of a good friend of his.  Explained that all the feelings and thoughts that he is having is normal and mention the possibility of using hospice bereavement counseling or seeing a psychologist. Return in about 1 year (around 08/12/2022) for cpe .     JJill Alexanders MD

## 2021-08-12 LAB — CBC WITH DIFFERENTIAL/PLATELET
Basophils Absolute: 0.1 10*3/uL (ref 0.0–0.2)
Basos: 1 %
EOS (ABSOLUTE): 0.2 10*3/uL (ref 0.0–0.4)
Eos: 3 %
Hematocrit: 43.7 % (ref 37.5–51.0)
Hemoglobin: 14.1 g/dL (ref 13.0–17.7)
Immature Grans (Abs): 0 10*3/uL (ref 0.0–0.1)
Immature Granulocytes: 0 %
Lymphocytes Absolute: 1.7 10*3/uL (ref 0.7–3.1)
Lymphs: 24 %
MCH: 24.4 pg — ABNORMAL LOW (ref 26.6–33.0)
MCHC: 32.3 g/dL (ref 31.5–35.7)
MCV: 76 fL — ABNORMAL LOW (ref 79–97)
Monocytes Absolute: 0.5 10*3/uL (ref 0.1–0.9)
Monocytes: 8 %
Neutrophils Absolute: 4.6 10*3/uL (ref 1.4–7.0)
Neutrophils: 64 %
Platelets: 193 10*3/uL (ref 150–450)
RBC: 5.79 x10E6/uL (ref 4.14–5.80)
RDW: 15 % (ref 11.6–15.4)
WBC: 7 10*3/uL (ref 3.4–10.8)

## 2021-08-12 LAB — COMPREHENSIVE METABOLIC PANEL
ALT: 39 IU/L (ref 0–44)
AST: 34 IU/L (ref 0–40)
Albumin/Globulin Ratio: 1.8 (ref 1.2–2.2)
Albumin: 4.6 g/dL (ref 3.8–4.8)
Alkaline Phosphatase: 71 IU/L (ref 44–121)
BUN/Creatinine Ratio: 10 (ref 10–24)
BUN: 11 mg/dL (ref 8–27)
Bilirubin Total: 0.5 mg/dL (ref 0.0–1.2)
CO2: 24 mmol/L (ref 20–29)
Calcium: 9.1 mg/dL (ref 8.6–10.2)
Chloride: 101 mmol/L (ref 96–106)
Creatinine, Ser: 1.12 mg/dL (ref 0.76–1.27)
Globulin, Total: 2.5 g/dL (ref 1.5–4.5)
Glucose: 81 mg/dL (ref 70–99)
Potassium: 4.2 mmol/L (ref 3.5–5.2)
Sodium: 139 mmol/L (ref 134–144)
Total Protein: 7.1 g/dL (ref 6.0–8.5)
eGFR: 75 mL/min/{1.73_m2} (ref >59)

## 2021-08-12 LAB — LIPID PANEL
Chol/HDL Ratio: 1.7 ratio (ref 0.0–5.0)
Cholesterol, Total: 96 mg/dL — ABNORMAL LOW (ref 100–199)
HDL: 55 mg/dL (ref >39)
LDL Chol Calc (NIH): 28 mg/dL (ref 0–99)
Triglycerides: 56 mg/dL (ref 0–149)
VLDL Cholesterol Cal: 13 mg/dL (ref 5–40)

## 2021-08-12 LAB — TESTOSTERONE: Testosterone: 236 ng/dL — ABNORMAL LOW (ref 264–916)

## 2021-08-12 LAB — PSA: Prostate Specific Ag, Serum: 0.4 ng/mL (ref 0.0–4.0)

## 2021-08-12 LAB — TSH: TSH: 1.04 u[IU]/mL (ref 0.450–4.500)

## 2021-08-12 NOTE — Addendum Note (Signed)
Addended by: Denita Lung on: 08/12/2021 01:03 PM   Modules accepted: Orders

## 2021-08-21 ENCOUNTER — Telehealth (INDEPENDENT_AMBULATORY_CARE_PROVIDER_SITE_OTHER): Payer: No Typology Code available for payment source | Admitting: Family Medicine

## 2021-08-21 VITALS — BP 128/65 | HR 87 | Temp 97.9°F | Wt 228.4 lb

## 2021-08-21 DIAGNOSIS — J069 Acute upper respiratory infection, unspecified: Secondary | ICD-10-CM | POA: Diagnosis not present

## 2021-08-21 NOTE — Telephone Encounter (Signed)
Appt was made. KH 

## 2021-08-21 NOTE — Progress Notes (Signed)
   Subjective:    Patient ID: John Clay, male    DOB: 04-19-1960, 61 y.o.   MRN: [id number]  HPI Documentation for virtual audio and video telecommunications through [facility] encounter: The patient was located at home. 2 patient identifiers used.  The provider was located in the office. The patient did consent to this visit and is aware of possible charges through their insurance for this visit. The other persons participating in this telemedicine service were none. Time spent on call was 5 minutes and in review of previous records >15 minutes total for counseling and coordination of care. This virtual service is not related to other E/M service within previous 7 days.  He states that he went to [facility] Thursday and came back on Sunday and Monday developed some chest congestion.  Tuesday he did test for COVID and was negative.  He also had a slight cough that day.  Wednesday he had more trouble with sore throat, slight rhinorrhea and fatigue.  Today he did COVID testing and was negative.  No fever, chills, earache.  Review of Systems     Objective:   Physical Exam Alert and in no distress otherwise not examined       Assessment & Plan:   Viral URI I explained that I thought his symptoms were all viral related and at this point symptomatic care would be appropriate.  He was comfortable with that.

## 2021-08-29 ENCOUNTER — Telehealth: Payer: Self-pay

## 2021-08-29 ENCOUNTER — Inpatient Hospital Stay
Admission: RE | Admit: 2021-08-29 | Discharge: 2021-08-29 | Disposition: A | Discharge status: Home / Self Care | Payer: No Typology Code available for payment source | Source: Ambulatory Visit | Attending: Family Medicine | Admitting: Family Medicine

## 2021-08-29 MED ORDER — AZITHROMYCIN 500 MG PO TABS: 500.0000 mg | ORAL_TABLET | Freq: Every day | ORAL | 0 refills | Status: DC

## 2021-08-29 NOTE — Addendum Note (Signed)
Addended by: Denita Lung on: 08/29/2021 10:48 AM   Modules accepted: Orders

## 2021-08-29 NOTE — Telephone Encounter (Signed)
Pt is still not feeling any better. Please advise if anything can be sent in.   Elyse Jarvis RMA

## 2021-08-29 NOTE — Telephone Encounter (Signed)
Pt called and said he is not feeling better since last visit. He still having ST,nasal congestion and coughing. No fever and still Covid negative. He wants to know if you want to see him again or call something in?

## 2021-09-01 ENCOUNTER — Other Ambulatory Visit: Payer: Self-pay | Admitting: Family Medicine

## 2021-09-01 DIAGNOSIS — E291 Testicular hypofunction: Secondary | ICD-10-CM

## 2021-09-01 NOTE — Telephone Encounter (Signed)
[facility] is requesting to fill pt testosterone. Please advised . [place]

## 2021-09-10 ENCOUNTER — Other Ambulatory Visit: Payer: Self-pay

## 2021-09-10 ENCOUNTER — Other Ambulatory Visit: Payer: No Typology Code available for payment source

## 2021-09-10 DIAGNOSIS — E291 Testicular hypofunction: Secondary | ICD-10-CM

## 2021-09-11 LAB — TESTOSTERONE: Testosterone: 451 ng/dL (ref 264–916)

## 2021-09-26 ENCOUNTER — Ambulatory Visit: Payer: No Typology Code available for payment source | Admitting: Family Medicine

## 2021-09-26 ENCOUNTER — Encounter: Payer: Self-pay | Admitting: Family Medicine

## 2021-09-26 VITALS — BP 130/70 | HR 67 | Temp 97.7°F | Wt 233.6 lb

## 2021-09-26 DIAGNOSIS — J019 Acute sinusitis, unspecified: Secondary | ICD-10-CM | POA: Diagnosis not present

## 2021-09-26 MED ORDER — AMOXICILLIN-POT CLAVULANATE 875-125 MG PO TABS: 1.0000 | ORAL_TABLET | Freq: Two times a day (BID) | ORAL | 0 refills | Status: DC

## 2021-09-26 MED ORDER — PREDNISONE 10 MG (48) PO TBPK: ORAL_TABLET | ORAL | 0 refills | Status: DC

## 2021-09-26 NOTE — Progress Notes (Signed)
   Subjective:    Patient ID: John Clay, male    DOB: 10-07-1960, 61 y.o.   MRN: [id number]  HPI He does have spring and fall allergies with sneezing, rhinorrhea, itchy watery eyes.  In June he had a sinus infection and apparently the Z-Pak got it back to normal however within the last week he is again having difficulty with sneezing, rhinorrhea, sinus congestion, postnasal drainage and occasional wheezing.   Review of Systems     Objective:   Physical Exam Alert and in no distress.  Nasal mucosa is erythematous with nontender sinuses.  Tympanic membranes and canals are normal. Pharyngeal area is normal. Neck is supple without adenopathy or thyromegaly. Cardiac exam shows a regular sinus rhythm without murmurs or gallops. Lungs are clear to auscultation.        Assessment & Plan:  Acute sinusitis, recurrence not specified, unspecified location - Plan: amoxicillin-clavulanate (AUGMENTIN) 875-125 MG tablet, predniSONE (STERAPRED UNI-PAK 48 TAB) 10 MG (48) TBPK tablet I explained that I thought it would be a good idea to aggressively treat this with steroids and the antibiotic.  Discussed continuing on his present medication as well as using a nasal steroid spray and also use inhaled steroid spray during the allergy season.

## 2021-09-26 NOTE — Patient Instructions (Signed)
Take both medications until they are gone.  You can also take either Flonase or Rhinocort now as well but definitely do it during your allergy season as well as the pill

## 2021-10-28 ENCOUNTER — Other Ambulatory Visit: Payer: Self-pay | Admitting: Family Medicine

## 2021-10-28 DIAGNOSIS — I10 Essential (primary) hypertension: Secondary | ICD-10-CM

## 2021-11-01 ENCOUNTER — Telehealth: Payer: Self-pay | Admitting: Family Medicine

## 2021-11-01 NOTE — Telephone Encounter (Signed)
P.A. TESTOSTERONE 30MG/ACT °

## 2021-11-15 ENCOUNTER — Telehealth: Payer: No Typology Code available for payment source | Admitting: Nurse Practitioner

## 2021-11-15 DIAGNOSIS — U071 COVID-19: Secondary | ICD-10-CM

## 2021-11-15 MED ORDER — NIRMATRELVIR/RITONAVIR (PAXLOVID)TABLET: 3.0000 | ORAL_TABLET | Freq: Two times a day (BID) | ORAL | 0 refills | Status: AC

## 2021-11-15 MED ORDER — AZELASTINE-FLUTICASONE 137-50 MCG/ACT NA SUSP: 1.0000 | Freq: Two times a day (BID) | NASAL | 0 refills | Status: DC

## 2021-11-15 NOTE — Progress Notes (Signed)
Virtual Visit Consent   John Clay, you are scheduled for a virtual visit with a [facility] provider today. Just as with appointments in the office, your consent must be obtained to participate. Your consent will be active for this visit and any virtual visit you may have with one of our providers in the next 365 days. If you have a MyChart account, a copy of this consent can be sent to you electronically.  As this is a virtual visit, video technology does not allow for your provider to perform a traditional examination. This may limit your provider's ability to fully assess your condition. If your provider identifies any concerns that need to be evaluated in person or the need to arrange testing (such as labs, EKG, etc.), we will make arrangements to do so. Although advances in technology are sophisticated, we cannot ensure that it will always work on either your end or our end. If the connection with a video visit is poor, the visit may have to be switched to a telephone visit. With either a video or telephone visit, we are not always able to ensure that we have a secure connection.  By engaging in this virtual visit, you consent to the provision of healthcare and authorize for your insurance to be billed (if applicable) for the services provided during this visit. Depending on your insurance coverage, you may receive a charge related to this service.  I need to obtain your verbal consent now. Are you willing to proceed with your visit today? John Clay has provided verbal consent on 11/15/2021 for a virtual visit (video or telephone). [name], NP  Date: 11/15/2021 6:26 PM  Virtual Visit via Video Note   I, [name], connected with  John Clay  ([id number], [birth year]) on 11/15/21 at  6:15 PM EDT by a video-enabled telemedicine application and verified that I am speaking with the correct person using two identifiers.  Location: Patient: Virtual Visit Location  Patient: Home Provider: Virtual Visit Location Provider: Home Office   I discussed the limitations of evaluation and management by telemedicine and the availability of in person appointments. The patient expressed understanding and agreed to proceed.    History of Present Illness: John Clay is a 61 y.o. who identifies as a male who was assigned male at birth, and is being seen today for [place].   Tested positive for COVID today. Currently experiencing the following symptoms: fever 99.9, cough, stuffy nose, body aches, fatigue, post nasal drip, headache.   Problems:  Patient Active Problem List   Diagnosis Date Noted   Hyperlipidemia 03/03/2021   Nonrheumatic aortic valve stenosis 02/05/2021   Trifascicular block 02/05/2021   Abnormal EKG 11/15/2020   Bruit of left carotid artery 11/15/2020   Tubular adenoma of colon 03/15/2019   MVP (mitral valve prolapse) 04/26/2017   Hypogonadism in male 04/26/2017   Acquired hypothyroidism 04/26/2017   Seasonal allergic rhinitis due to pollen 04/26/2017   History of Hodgkin's lymphoma 02/19/2016   Essential hypertension 02/19/2016   OSA (obstructive sleep apnea) 12/15/2012    Allergies:  Allergies  Allergen Reactions   Other Other (See Comments)    ALLERGEN: Chlorpheniramine-phenylpropan  REACTIONS: tchy eyes, sneezing, stuffiness   Medications:  Current Outpatient Medications:    Azelastine-Fluticasone 137-50 MCG/ACT SUSP, Place 1 spray into the nose every 12 (twelve) hours., Disp: 23 g, Rfl: 0   nirmatrelvir/ritonavir EUA (PAXLOVID) 20 x 150 MG & 10 x '100MG'$  TABS, Take  3 tablets by mouth 2 (two) times daily for 5 days. (Take nirmatrelvir 150 mg two tablets twice daily for 5 days and ritonavir 100 mg one tablet twice daily for 5 days) Patient GFR is 75, Disp: 30 tablet, Rfl: 0   amLODipine (NORVASC) 10 MG tablet, Take 1 tablet (10 mg total) by mouth daily., Disp: 90 tablet, Rfl: 3   amoxicillin-clavulanate  (AUGMENTIN) 875-125 MG tablet, Take 1 tablet by mouth 2 (two) times daily., Disp: 20 tablet, Rfl: 0   azithromycin (ZITHROMAX) 500 MG tablet, Take 1 tablet (500 mg total) by mouth daily. (Patient not taking: Reported on 09/26/2021), Disp: 3 tablet, Rfl: 0   levothyroxine (SYNTHROID) 88 MCG tablet, Take 1 tablet (88 mcg total) by mouth daily., Disp: 90 tablet, Rfl: 3   losartan (COZAAR) 25 MG tablet, Take 1 tablet (25 mg total) by mouth daily., Disp: 90 tablet, Rfl: 3   metoprolol succinate (TOPROL-XL) 50 MG 24 hr tablet, TAKE 1 TABLET BY MOUTH DAILY WITH OR IMMEDIATELY FOLLOWING A MEAL., Disp: 90 tablet, Rfl: 1   Multiple Vitamin (MULTI-VITAMINS) TABS, Take 1 tablet by mouth daily., Disp: , Rfl:    predniSONE (STERAPRED UNI-PAK 48 TAB) 10 MG (48) TBPK tablet, Take as per manufacturer's recommendations, Disp: 48 tablet, Rfl: 0   rosuvastatin (CRESTOR) 40 MG tablet, Take 1 tablet (40 mg total) by mouth daily., Disp: 90 tablet, Rfl: 3   Testosterone 30 MG/ACT SOLN, APPLY 1 PUMP TO EACH ARM DAILY, Disp: 90 mL, Rfl: 1  Observations/Objective: Patient is well-developed, well-nourished in no acute distress.  Resting comfortably  at home.  Head is normocephalic, atraumatic.  No labored breathing.  Speech is clear and coherent with logical content.  Patient is alert and oriented at baseline.    Assessment and Plan: 1. Positive self-administered antigen test for COVID-19 - nirmatrelvir/ritonavir EUA (PAXLOVID) 20 x 150 MG & 10 x '100MG'$  TABS; Take 3 tablets by mouth 2 (two) times daily for 5 days. (Take nirmatrelvir 150 mg two tablets twice daily for 5 days and ritonavir 100 mg one tablet twice daily for 5 days) Patient GFR is 75  Dispense: 30 tablet; Refill: 0 - Azelastine-Fluticasone 137-50 MCG/ACT SUSP; Place 1 spray into the nose every 12 (twelve) hours.  Dispense: 23 g; Refill: 0   Follow Up Instructions: I discussed the assessment and treatment plan with the patient. The patient was provided an  opportunity to ask questions and all were answered. The patient agreed with the plan and demonstrated an understanding of the instructions.  A copy of instructions were sent to the patient via MyChart unless otherwise noted below.     The patient was advised to call back or seek an in-person evaluation if the symptoms worsen or if the condition fails to improve as anticipated.  Time:  I spent 11 minutes with the patient via telehealth technology discussing the above problems/concerns.

## 2021-11-15 NOTE — Patient Instructions (Signed)
If you test positive, whether you have symptoms or not Regardless of your vaccination status or infection history:  Isolate for at least 5 days Sleep and stay in a separate room from those not infected Use a separate bathroom if you can Wear a mask around others, even at home  You can end isolation early, after Day 5, if: You have no fever for 24 hours without taking fever-reducing medication, AND Your other symptoms are gone or improving  If you still have a fever, continue to isolate until the fever is gone for at least 24 hours If other symptoms are not improving, continue to isolate through Day 10  After you end isolation: Wear a mask around others for 10 full days after start of symptoms. If you had no symptoms, wear a mask for 10 full days after your positive test. You may remove your mask sooner than Day 10 if you have two negative tests in a row, at least one day apart.   John Clay, thank you for joining John Pounds, NP for today's virtual visit.  While this provider is not your primary care provider (PCP), if your PCP is located in our provider database this encounter information will be shared with them immediately following your visit.  Consent: (Patient) John Clay provided verbal consent for this virtual visit at the beginning of the encounter.  Current Medications:  Current Outpatient Medications:    Azelastine-Fluticasone 137-50 MCG/ACT SUSP, Place 1 spray into the nose every 12 (twelve) hours., Disp: 23 g, Rfl: 0   nirmatrelvir/ritonavir EUA (PAXLOVID) 20 x 150 MG & 10 x '100MG'$  TABS, Take 3 tablets by mouth 2 (two) times daily for 5 days. (Take nirmatrelvir 150 mg two tablets twice daily for 5 days and ritonavir 100 mg one tablet twice daily for 5 days) Patient GFR is 75, Disp: 30 tablet, Rfl: 0   amLODipine (NORVASC) 10 MG tablet, Take 1 tablet (10 mg total) by mouth daily., Disp: 90 tablet, Rfl: 3   amoxicillin-clavulanate (AUGMENTIN) 875-125 MG  tablet, Take 1 tablet by mouth 2 (two) times daily., Disp: 20 tablet, Rfl: 0   azithromycin (ZITHROMAX) 500 MG tablet, Take 1 tablet (500 mg total) by mouth daily. (Patient not taking: Reported on 09/26/2021), Disp: 3 tablet, Rfl: 0   levothyroxine (SYNTHROID) 88 MCG tablet, Take 1 tablet (88 mcg total) by mouth daily., Disp: 90 tablet, Rfl: 3   losartan (COZAAR) 25 MG tablet, Take 1 tablet (25 mg total) by mouth daily., Disp: 90 tablet, Rfl: 3   metoprolol succinate (TOPROL-XL) 50 MG 24 hr tablet, TAKE 1 TABLET BY MOUTH DAILY WITH OR IMMEDIATELY FOLLOWING A MEAL., Disp: 90 tablet, Rfl: 1   Multiple Vitamin (MULTI-VITAMINS) TABS, Take 1 tablet by mouth daily., Disp: , Rfl:    predniSONE (STERAPRED UNI-PAK 48 TAB) 10 MG (48) TBPK tablet, Take as per manufacturer's recommendations, Disp: 48 tablet, Rfl: 0   rosuvastatin (CRESTOR) 40 MG tablet, Take 1 tablet (40 mg total) by mouth daily., Disp: 90 tablet, Rfl: 3   Testosterone 30 MG/ACT SOLN, APPLY 1 PUMP TO EACH ARM DAILY, Disp: 90 mL, Rfl: 1   Medications ordered in this encounter:  Meds ordered this encounter  Medications   nirmatrelvir/ritonavir EUA (PAXLOVID) 20 x 150 MG & 10 x '100MG'$  TABS    Sig: Take 3 tablets by mouth 2 (two) times daily for 5 days. (Take nirmatrelvir 150 mg two tablets twice daily for 5 days and ritonavir 100 mg one tablet  twice daily for 5 days) Patient GFR is 75    Dispense:  30 tablet    Refill:  0    Order Specific Question:   Supervising Provider    Answer:   Noemi Chapel [[id number]]   Azelastine-Fluticasone 137-50 MCG/ACT SUSP    Sig: Place 1 spray into the nose every 12 (twelve) hours.    Dispense:  23 g    Refill:  0    Order Specific Question:   Supervising Provider    Answer:   Sabra Heck, [place]     *If you need refills on other medications prior to your next appointment, please contact your pharmacy*  Follow-Up: Call back or seek an in-person evaluation if the symptoms worsen or if the condition fails to  improve as anticipated.  Other Instructions See above   If you have been instructed to have an in-person evaluation today at a local Urgent Care facility, please use the link below. It will take you to a list of all of our available [facility], including address, phone number and hours of operation. Please do not delay care.  [facility]  If you or a family member do not have a primary care provider, use the link below to schedule a visit and establish care. When you choose a [facility] primary care physician or advanced practice provider, you gain a long-term partner in health. Find a Primary Care Provider  Learn more about [facility]'s in-office and virtual care options: [facility]

## 2021-11-25 ENCOUNTER — Other Ambulatory Visit: Payer: Self-pay | Admitting: Family Medicine

## 2021-11-25 DIAGNOSIS — E291 Testicular hypofunction: Secondary | ICD-10-CM

## 2021-11-25 NOTE — Telephone Encounter (Signed)
Pt. Request refill on testosterone last apt 09/26/21 next apt 08/24/22.

## 2021-11-26 ENCOUNTER — Encounter: Payer: Self-pay | Admitting: Internal Medicine

## 2021-12-03 ENCOUNTER — Other Ambulatory Visit (INDEPENDENT_AMBULATORY_CARE_PROVIDER_SITE_OTHER): Payer: No Typology Code available for payment source

## 2021-12-03 DIAGNOSIS — Z23 Encounter for immunization: Secondary | ICD-10-CM

## 2021-12-04 NOTE — Telephone Encounter (Signed)
Was dosage issue, going thru now for $10

## 2021-12-05 ENCOUNTER — Other Ambulatory Visit: Payer: Self-pay

## 2021-12-05 ENCOUNTER — Emergency Department (HOSPITAL_BASED_OUTPATIENT_CLINIC_OR_DEPARTMENT_OTHER): Payer: No Typology Code available for payment source

## 2021-12-05 ENCOUNTER — Encounter (HOSPITAL_BASED_OUTPATIENT_CLINIC_OR_DEPARTMENT_OTHER): Payer: Self-pay | Admitting: Emergency Medicine

## 2021-12-05 ENCOUNTER — Other Ambulatory Visit (HOSPITAL_BASED_OUTPATIENT_CLINIC_OR_DEPARTMENT_OTHER): Payer: Self-pay

## 2021-12-05 ENCOUNTER — Telehealth: Payer: No Typology Code available for payment source | Admitting: Emergency Medicine

## 2021-12-05 ENCOUNTER — Emergency Department (HOSPITAL_BASED_OUTPATIENT_CLINIC_OR_DEPARTMENT_OTHER): Payer: No Typology Code available for payment source | Admitting: Radiology

## 2021-12-05 ENCOUNTER — Encounter: Payer: Self-pay | Admitting: Family Medicine

## 2021-12-05 ENCOUNTER — Emergency Department (HOSPITAL_BASED_OUTPATIENT_CLINIC_OR_DEPARTMENT_OTHER)
Admission: EM | Admit: 2021-12-05 | Discharge: 2021-12-05 | Disposition: A | Discharge status: Home / Self Care | Payer: No Typology Code available for payment source | Attending: Emergency Medicine | Admitting: Emergency Medicine

## 2021-12-05 DIAGNOSIS — Z79899 Other long term (current) drug therapy: Secondary | ICD-10-CM | POA: Diagnosis not present

## 2021-12-05 DIAGNOSIS — R0602 Shortness of breath: Secondary | ICD-10-CM | POA: Diagnosis present

## 2021-12-05 DIAGNOSIS — J189 Pneumonia, unspecified organism: Secondary | ICD-10-CM

## 2021-12-05 DIAGNOSIS — U099 Post covid-19 condition, unspecified: Secondary | ICD-10-CM

## 2021-12-05 DIAGNOSIS — Z8616 Personal history of COVID-19: Secondary | ICD-10-CM | POA: Diagnosis not present

## 2021-12-05 DIAGNOSIS — I10 Essential (primary) hypertension: Secondary | ICD-10-CM | POA: Diagnosis not present

## 2021-12-05 HISTORY — DX: Hyperlipidemia, unspecified: E78.5

## 2021-12-05 HISTORY — DX: Essential (primary) hypertension: I10

## 2021-12-05 LAB — TROPONIN I (HIGH SENSITIVITY)
Troponin I (High Sensitivity): 18 ng/L — ABNORMAL HIGH (ref <18)
Troponin I (High Sensitivity): 17 ng/L (ref <18)

## 2021-12-05 LAB — CBC WITH DIFFERENTIAL/PLATELET
Abs Immature Granulocytes: 0.01 10*3/uL (ref 0.00–0.07)
Basophils Absolute: 0.1 10*3/uL (ref 0.0–0.1)
Basophils Relative: 1 %
Eosinophils Absolute: 0.2 10*3/uL (ref 0.0–0.5)
Eosinophils Relative: 3 %
HCT: 45.6 % (ref 39.0–52.0)
Hemoglobin: 14.6 g/dL (ref 13.0–17.0)
Immature Granulocytes: 0 %
Lymphocytes Relative: 21 %
Lymphs Abs: 1.5 10*3/uL (ref 0.7–4.0)
MCH: 24.5 pg — ABNORMAL LOW (ref 26.0–34.0)
MCHC: 32 g/dL (ref 30.0–36.0)
MCV: 76.6 fL — ABNORMAL LOW (ref 80.0–100.0)
Monocytes Absolute: 0.5 10*3/uL (ref 0.1–1.0)
Monocytes Relative: 8 %
Neutro Abs: 4.7 10*3/uL (ref 1.7–7.7)
Neutrophils Relative %: 67 %
Platelets: 229 10*3/uL (ref 150–400)
RBC: 5.95 MIL/uL — ABNORMAL HIGH (ref 4.22–5.81)
RDW: 16.8 % — ABNORMAL HIGH (ref 11.5–15.5)
WBC: 6.9 10*3/uL (ref 4.0–10.5)
nRBC: 0 % (ref 0.0–0.2)

## 2021-12-05 LAB — BASIC METABOLIC PANEL
Anion gap: 12 (ref 5–15)
BUN: 14 mg/dL (ref 8–23)
CO2: 23 mmol/L (ref 22–32)
Calcium: 9.9 mg/dL (ref 8.9–10.3)
Chloride: 103 mmol/L (ref 98–111)
Creatinine, Ser: 1.24 mg/dL (ref 0.61–1.24)
GFR, Estimated: >60 mL/min (ref >60)
Glucose, Bld: 92 mg/dL (ref 70–99)
Potassium: 3.9 mmol/L (ref 3.5–5.1)
Sodium: 138 mmol/L (ref 135–145)

## 2021-12-05 LAB — D-DIMER, QUANTITATIVE: D-Dimer, Quant: 0.62 ug/mL-FEU — ABNORMAL HIGH (ref 0.00–0.50)

## 2021-12-05 LAB — BRAIN NATRIURETIC PEPTIDE: B Natriuretic Peptide: 33.4 pg/mL (ref 0.0–100.0)

## 2021-12-05 MED ORDER — IOHEXOL 350 MG/ML SOLN
100.0000 mL | Freq: Once | INTRAVENOUS | Status: AC | PRN
  Administered 2021-12-05: 80 mL via INTRAVENOUS

## 2021-12-05 MED ORDER — DOXYCYCLINE HYCLATE 100 MG PO CAPS
100.0000 mg | ORAL_CAPSULE | Freq: Two times a day (BID) | ORAL | 0 refills | Status: AC
  Filled 2021-12-05: qty 14, 7d supply, fill #0

## 2021-12-05 MED ORDER — ALBUTEROL SULFATE HFA 108 (90 BASE) MCG/ACT IN AERS: 2.0000 | INHALATION_SPRAY | RESPIRATORY_TRACT | 0 refills | Status: DC | PRN

## 2021-12-05 MED ORDER — AZITHROMYCIN 250 MG PO TABS: ORAL_TABLET | ORAL | 0 refills | Status: DC

## 2021-12-05 MED ORDER — DOXYCYCLINE HYCLATE 100 MG PO TABS
100.0000 mg | ORAL_TABLET | Freq: Once | ORAL | Status: AC
  Administered 2021-12-05: 100 mg via ORAL
  Filled 2021-12-05: qty 1

## 2021-12-05 NOTE — ED Provider Notes (Signed)
[facility] EMERGENCY DEPT Provider Note   CSN: [id number] Arrival date & time: 12/05/21  1130     History  Chief Complaint  Patient presents with   Shortness of Breath    John Clay is a 61 y.o. male.  Patient here with shortness of breath, productive cough.  Slightly worse with exertion is a shortness of breath.  Had COVID last month.  Got better but has had shortness of breath the last couple weeks.  History of hypertension and lymphoma and hypertension.  Nothing makes it better.  Denies any fevers or chills.  No recent travel or surgery.  Denies any chest pain.  The history is provided by the patient.       Home Medications Prior to Admission medications   Medication Sig Start Date End Date Taking? Authorizing Provider  doxycycline (VIBRAMYCIN) 100 MG capsule Take 1 capsule (100 mg total) by mouth 2 (two) times daily for 7 days. 12/05/21 12/12/21 Yes Jacole Capley, DO  albuterol (VENTOLIN HFA) 108 (90 Base) MCG/ACT inhaler Inhale 2 puffs into the lungs every 4 (four) hours as needed for wheezing or shortness of breath. 12/05/21   Montine Circle, PA-C  amLODipine (NORVASC) 10 MG tablet Take 1 tablet (10 mg total) by mouth daily. 08/11/21   Denita Lung, MD  amoxicillin-clavulanate (AUGMENTIN) 875-125 MG tablet Take 1 tablet by mouth 2 (two) times daily. 09/26/21   Denita Lung, MD  Azelastine-Fluticasone [phone] MCG/ACT SUSP Place 1 spray into the nose every 12 (twelve) hours. 11/15/21   [name], NP  azithromycin (ZITHROMAX) 250 MG tablet Take 2 tabs today, then take 1 tab daily until gone. 12/05/21   Montine Circle, PA-C  levothyroxine (SYNTHROID) 88 MCG tablet Take 1 tablet (88 mcg total) by mouth daily. 08/11/21   Denita Lung, MD  losartan (COZAAR) 25 MG tablet Take 1 tablet (25 mg total) by mouth daily. 08/11/21   Denita Lung, MD  metoprolol succinate (TOPROL-XL) 50 MG 24 hr tablet TAKE 1 TABLET BY MOUTH DAILY WITH OR IMMEDIATELY  FOLLOWING A MEAL. 10/28/21   Denita Lung, MD  Multiple Vitamin (MULTI-VITAMINS) TABS Take 1 tablet by mouth daily.    [provider]  predniSONE (STERAPRED UNI-PAK 48 TAB) 10 MG (48) TBPK tablet Take as per manufacturer's recommendations 09/26/21   Denita Lung, MD  rosuvastatin (CRESTOR) 40 MG tablet Take 1 tablet (40 mg total) by mouth daily. 08/11/21   Denita Lung, MD  Testosterone 30 MG/ACT SOLN APPLY 1 PUMP TO EACH ARM DAILY 11/25/21   Denita Lung, MD      Allergies    Other    Review of Systems   Review of Systems  Physical Exam Updated Vital Signs BP (!) 140/79   Pulse 97   Temp 97.8 F (36.6 C)   Resp 18   Ht '6\' 3"'$  (1.905 m)   Wt 98 kg   SpO2 96%   BMI 27.00 kg/m  Physical Exam Vitals and nursing note reviewed.  Constitutional:      General: He is not in acute distress.    Appearance: He is well-developed. He is not ill-appearing.  HENT:     Head: Normocephalic and atraumatic.  Eyes:     Conjunctiva/sclera: Conjunctivae normal.     Pupils: Pupils are equal, round, and reactive to light.  Cardiovascular:     Rate and Rhythm: Normal rate and regular rhythm.     Pulses: Normal pulses.  Heart sounds: Normal heart sounds. No murmur heard. Pulmonary:     Effort: Pulmonary effort is normal. No respiratory distress.     Breath sounds: Normal breath sounds.  Abdominal:     Palpations: Abdomen is soft.     Tenderness: There is no abdominal tenderness.  Musculoskeletal:        General: No swelling. Normal range of motion.     Cervical back: Normal range of motion and neck supple.     Right lower leg: No edema.     Left lower leg: No edema.  Skin:    General: Skin is warm and dry.     Capillary Refill: Capillary refill takes less than 2 seconds.  Neurological:     Mental Status: He is alert.  Psychiatric:        Mood and Affect: Mood normal.     ED Results / Procedures / Treatments   Labs (all labs ordered are listed, but only abnormal  results are displayed) Labs Reviewed  CBC WITH DIFFERENTIAL/PLATELET - Abnormal; Notable for the following components:      Result Value   RBC 5.95 (*)    MCV 76.6 (*)    MCH 24.5 (*)    RDW 16.8 (*)    All other components within normal limits  D-DIMER, QUANTITATIVE - Abnormal; Notable for the following components:   D-Dimer, Quant 0.62 (*)    All other components within normal limits  TROPONIN I (HIGH SENSITIVITY) - Abnormal; Notable for the following components:   Troponin I (High Sensitivity) 18 (*)    All other components within normal limits  BASIC METABOLIC PANEL  BRAIN NATRIURETIC PEPTIDE  TROPONIN I (HIGH SENSITIVITY)    EKG EKG Interpretation  Date/Time:  Friday December 05 2021 11:38:13 EDT Ventricular Rate:  109 PR Interval:  168 QRS Duration: 122 QT Interval:  344 QTC Calculation: 463 R Axis:   -68 Text Interpretation: Sinus tachycardia Right atrial enlargement When compared with ECG of 20-Nov-2020 11:42, No significant change was found Confirmed by Lennice Sites ([id number]) on 12/05/2021 11:43:15 AM  Radiology CT Angio Chest PE W and/or Wo Contrast  Result Date: 12/05/2021 CLINICAL DATA:  Shortness of breath for 2 weeks. History of asthma. History of Hodgkin's lymphoma. EXAM: CT ANGIOGRAPHY CHEST WITH CONTRAST TECHNIQUE: Multidetector CT imaging of the chest was performed using the standard protocol during bolus administration of intravenous contrast. Multiplanar CT image reconstructions and MIPs were obtained to evaluate the vascular anatomy. RADIATION DOSE REDUCTION: This exam was performed according to the departmental dose-optimization program which includes automated exposure control, adjustment of the mA and/or kV according to patient size and/or use of iterative reconstruction technique. CONTRAST:  22m OMNIPAQUE IOHEXOL 350 MG/ML SOLN COMPARISON:  Same day chest radiograph, cardiac CT 12/10/2020 and CT chest 12/15/2002 FINDINGS: Cardiovascular: There is adequate  opacification of the pulmonary arteries to the segmental level. There is no evidence of pulmonary embolism. The heart size is normal. There is no pericardial effusion. Coronary artery calcifications, aortic valve calcifications, and dense mitral annular calcifications are noted. There is minimal calcified plaque in the thoracic aorta. Mediastinum/Nodes: The thyroid is unremarkable. The esophagus is grossly unremarkable. There is no mediastinal, hilar, or axillary lymphadenopathy. Lungs/Pleura: The trachea and central airways are patent. There is focal consolidative and ground-glass opacity in the medial left apex suspicious for infection. There is no other focal airspace disease. There is no pulmonary edema. There is no pleural effusion or pneumothorax There are no solid nodules. Upper Abdomen:  A solid lesion in the left upper quadrant likely reflects splenic tissue and is unchanged going back to 2004. The imaged portions of the upper abdominal viscera are otherwise unremarkable. Musculoskeletal: There is no acute osseous abnormality or suspicious osseous lesion. Review of the MIP images confirms the above findings. IMPRESSION: 1. Focal consolidative/ground-glass opacity in the medial left apex suspicious for pneumonia. Recommend follow-up noncontrast chest CT in 3 months to ensure resolution. 2. No evidence of pulmonary embolism. 3. Aortic valve and dense mitral annular calcifications. Electronically Signed   By: Valetta Mole M.D.   On: 12/05/2021 13:16   DG Chest 2 View  Result Date: 12/05/2021 CLINICAL DATA:  Cough, shortness of breath 2 weeks EXAM: CHEST - 2 VIEW COMPARISON:  CT chest 12/10/2020 FINDINGS: The heart size and mediastinal contours are within normal limits. Both lungs are clear. The visualized skeletal structures are unremarkable. IMPRESSION: No active cardiopulmonary disease. Electronically Signed   By: Kathreen Devoid M.D.   On: 12/05/2021 12:04    Procedures Procedures    Medications  Ordered in ED Medications  iohexol (OMNIPAQUE) 350 MG/ML injection 100 mL (80 mLs Intravenous Contrast Given 12/05/21 1255)  doxycycline (VIBRA-TABS) tablet 100 mg (100 mg Oral Given 12/05/21 1333)    ED Course/ Medical Decision Making/ A&P                           Medical Decision Making Amount and/or Complexity of Data Reviewed Labs: ordered. Radiology: ordered.  Risk Prescription drug management.   [name] is here with shortness of breath.  History of lymphoma, hypertension, high cholesterol, recent COVID infection.  Shortness of breath with cough and sputum production and worse with exertion over the last 2 weeks.  Differential diagnosis is pneumonia versus PE versus less likely ACS, pneumothorax.  We will get CBC, BMP, troponin, D-dimer, EKG, chest x-ray.  EKG shows sinus tachycardia.  No ischemic changes.  No significant anemia, electrolyte abnormality, kidney injury, leukocytosis per my review and interpretation of labs.  Troponin is 18.  D-dimer mildly elevated.  We will get a CT scan of the chest.  Chest x-ray per my read shows no pneumonia pneumothorax.  Will trend troponin.  CT scan per radiology report shows suspected pneumonia.  Troponin stable.  I have no concern for ACS.  Patient given a dose of doxycycline.  Recommend that he follow-up with primary care doctor for repeat CT scan in a couple months to ensure resolution of this area of consolidation in his lung.  Clinically it sounds like he has pneumonia.  No concern for sepsis.  Understands return precautions.  Discharged in good condition.  This chart was dictated using voice recognition software.  Despite best efforts to proofread,  errors can occur which can change the documentation meaning.         Final Clinical Impression(s) / ED Diagnoses Final diagnoses:  Community acquired pneumonia, unspecified laterality    Rx / DC Orders ED Discharge Orders          Ordered    doxycycline (VIBRAMYCIN) 100  MG capsule  2 times daily        12/05/21 [place], [place], DO 12/05/21 1414

## 2021-12-05 NOTE — ED Notes (Signed)
COVID+ home test 11/09/21

## 2021-12-05 NOTE — Progress Notes (Signed)
Virtual Visit Consent   John Clay, you are scheduled for a virtual visit with a [facility] provider today. Just as with appointments in the office, your consent must be obtained to participate. Your consent will be active for this visit and any virtual visit you may have with one of our providers in the next 365 days. If you have a MyChart account, a copy of this consent can be sent to you electronically.  As this is a virtual visit, video technology does not allow for your provider to perform a traditional examination. This may limit your provider's ability to fully assess your condition. If your provider identifies any concerns that need to be evaluated in person or the need to arrange testing (such as labs, EKG, etc.), we will make arrangements to do so. Although advances in technology are sophisticated, we cannot ensure that it will always work on either your end or our end. If the connection with a video visit is poor, the visit may have to be switched to a telephone visit. With either a video or telephone visit, we are not always able to ensure that we have a secure connection.  By engaging in this virtual visit, you consent to the provision of healthcare and authorize for your insurance to be billed (if applicable) for the services provided during this visit. Depending on your insurance coverage, you may receive a charge related to this service.  I need to obtain your verbal consent now. Are you willing to proceed with your visit today? John Clay has provided verbal consent on 12/05/2021 for a virtual visit (video or telephone). Montine Circle, PA-C  Date: 12/05/2021 10:29 AM  Virtual Visit via Video Note   I, Montine Circle, connected with  John Clay  ([id number], 03/02/1961) on 12/05/21 at 10:30 AM EDT by a video-enabled telemedicine application and verified that I am speaking with the correct person using two identifiers.  Location: Patient: Virtual Visit  Location Patient: Home Provider: Virtual Visit Location Provider: Home   I discussed the limitations of evaluation and management by telemedicine and the availability of in person appointments. The patient expressed understanding and agreed to proceed.    History of Present Illness: John Clay is a 61 y.o. who identifies as a male who was assigned male at birth, and is being seen today for SOB.  Diagnosed with COVID a few weeks ago.  Took Paxlovid.  States that he has had progressively worsening SOB, especially at night.  Denies fever.  States that he had to sleep sitting up last night.  States that he feels better in the daytime.  Denies leg swelling.  HPI: HPI  Problems:  Patient Active Problem List   Diagnosis Date Noted   Hyperlipidemia 03/03/2021   Nonrheumatic aortic valve stenosis 02/05/2021   Trifascicular block 02/05/2021   Abnormal EKG 11/15/2020   Bruit of left carotid artery 11/15/2020   Tubular adenoma of colon 03/15/2019   MVP (mitral valve prolapse) 04/26/2017   Hypogonadism in male 04/26/2017   Acquired hypothyroidism 04/26/2017   Seasonal allergic rhinitis due to pollen 04/26/2017   History of Hodgkin's lymphoma 02/19/2016   Essential hypertension 02/19/2016   OSA (obstructive sleep apnea) 12/15/2012    Allergies:  Allergies  Allergen Reactions   Other Other (See Comments)    ALLERGEN: Chlorpheniramine-phenylpropan  REACTIONS: tchy eyes, sneezing, stuffiness   Medications:  Current Outpatient Medications:    amLODipine (NORVASC) 10 MG tablet, Take 1 tablet (10 mg total) by  mouth daily., Disp: 90 tablet, Rfl: 3   amoxicillin-clavulanate (AUGMENTIN) 875-125 MG tablet, Take 1 tablet by mouth 2 (two) times daily., Disp: 20 tablet, Rfl: 0   Azelastine-Fluticasone 137-50 MCG/ACT SUSP, Place 1 spray into the nose every 12 (twelve) hours., Disp: 23 g, Rfl: 0   azithromycin (ZITHROMAX) 500 MG tablet, Take 1 tablet (500 mg total) by mouth daily. (Patient not  taking: Reported on 09/26/2021), Disp: 3 tablet, Rfl: 0   levothyroxine (SYNTHROID) 88 MCG tablet, Take 1 tablet (88 mcg total) by mouth daily., Disp: 90 tablet, Rfl: 3   losartan (COZAAR) 25 MG tablet, Take 1 tablet (25 mg total) by mouth daily., Disp: 90 tablet, Rfl: 3   metoprolol succinate (TOPROL-XL) 50 MG 24 hr tablet, TAKE 1 TABLET BY MOUTH DAILY WITH OR IMMEDIATELY FOLLOWING A MEAL., Disp: 90 tablet, Rfl: 1   Multiple Vitamin (MULTI-VITAMINS) TABS, Take 1 tablet by mouth daily., Disp: , Rfl:    predniSONE (STERAPRED UNI-PAK 48 TAB) 10 MG (48) TBPK tablet, Take as per manufacturer's recommendations, Disp: 48 tablet, Rfl: 0   rosuvastatin (CRESTOR) 40 MG tablet, Take 1 tablet (40 mg total) by mouth daily., Disp: 90 tablet, Rfl: 3   Testosterone 30 MG/ACT SOLN, APPLY 1 PUMP TO EACH ARM DAILY, Disp: 90 mL, Rfl: 3  Observations/Objective: Patient is well-developed, well-nourished in no acute distress.  Resting comfortably  at home.  Head is normocephalic, atraumatic.  No labored breathing.  Speech is clear and coherent with logical content.  Patient is alert and oriented at baseline.    Assessment and Plan: 1. Post-COVID-19 syndrome manifesting as chronic shortness of breath   We discussed that this could be developing bacterial infection, fluid on the lungs, or other.  Recommend CXR.  Patient will [facility] to [facility].  Feel it's appropriate to start z-pak and MDI.  Discussed that if he worsens he should go to the ER.  Follow Up Instructions: I discussed the assessment and treatment plan with the patient. The patient was provided an opportunity to ask questions and all were answered. The patient agreed with the plan and demonstrated an understanding of the instructions.  A copy of instructions were sent to the patient via MyChart unless otherwise noted below.     The patient was advised to call back or seek an in-person evaluation if the symptoms worsen or if the condition  fails to improve as anticipated.  Time:  I spent 13 minutes with the patient via telehealth technology discussing the above problems/concerns.    Montine Circle, PA-C

## 2021-12-05 NOTE — Telephone Encounter (Signed)
Pt had a virtual visit earlier today. kH

## 2021-12-05 NOTE — ED Triage Notes (Signed)
Pt arrived POV, caox4, ambulatory. Pt c/o SOB x2 weeks. Pt reports COVID+ in mid August but his s/s subsided. Pt also c/o productive cough (clear phlegm). Pt states SOB is constant but does worsen on exertion and at night when laying down.

## 2021-12-05 NOTE — Discharge Instructions (Addendum)
Take next dose antibiotic tomorrow morning.  Please follow-up with your primary care doctor as radiology is recommending that you have a repeat CT scan of your chest in 3 months to make sure that area of infection improves.

## 2021-12-08 ENCOUNTER — Telehealth: Payer: Self-pay | Admitting: Family Medicine

## 2021-12-08 NOTE — Telephone Encounter (Signed)
Transition Care Management Follow-up Telephone Call Date of discharge and from where: 12/05/2021 [facility] How have you been since you were released from the hospital? Some better Any questions or concerns? Yes Pt states that he was initially getting better but shortness of breath has returned.  Items Reviewed: Did the pt receive and understand the discharge instructions provided? Yes  Medications obtained and verified? Yes Pt did pick up antibiotic but did not get albuterol. PT was encouraged to  pick up and take as instructed Other? Yes pt concerned about shortness of breath  Any new allergies since your discharge? No  Dietary orders reviewed? Yes Do you have support at home? Yes   Home Care and Equipment/Supplies: Were home health services ordered? no Follow up appointments reviewed:  PCP Hospital f/u appt confirmed? Yes  Scheduled to see JCL on 12/15/2021 @ 9:30. Are transportation arrangements needed? No  If their condition worsens, is the pt aware to call PCP or go to the Emergency Dept.? Yes Was the patient provided with contact information for the PCP's office or ED? Yes Was to pt encouraged to call back with questions or concerns? Yes    Pt is concerned about shortness of breath but upon questioning pt did not pick up albuterol was was prescribed for him. Pt was encouraged to pick up that med and take as instructed. PT can be reached at [phone].

## 2021-12-15 ENCOUNTER — Encounter: Payer: Self-pay | Admitting: Family Medicine

## 2021-12-15 ENCOUNTER — Ambulatory Visit: Payer: No Typology Code available for payment source | Admitting: Family Medicine

## 2021-12-15 ENCOUNTER — Ambulatory Visit
Admission: RE | Admit: 2021-12-15 | Discharge: 2021-12-15 | Disposition: A | Discharge status: Home / Self Care | Payer: No Typology Code available for payment source | Source: Ambulatory Visit | Attending: Family Medicine | Admitting: Family Medicine

## 2021-12-15 VITALS — BP 128/78 | HR 99 | Temp 98.6°F | Wt 221.8 lb

## 2021-12-15 DIAGNOSIS — Z8571 Personal history of Hodgkin lymphoma: Secondary | ICD-10-CM | POA: Diagnosis not present

## 2021-12-15 DIAGNOSIS — I359 Nonrheumatic aortic valve disorder, unspecified: Secondary | ICD-10-CM

## 2021-12-15 DIAGNOSIS — U099 Post covid-19 condition, unspecified: Secondary | ICD-10-CM

## 2021-12-15 DIAGNOSIS — R0602 Shortness of breath: Secondary | ICD-10-CM

## 2021-12-15 DIAGNOSIS — R0609 Other forms of dyspnea: Secondary | ICD-10-CM

## 2021-12-15 MED ORDER — MOMETASONE FUROATE 110 MCG/ACT IN AEPB: 1.0000 | INHALATION_SPRAY | Freq: Every day | RESPIRATORY_TRACT | 1 refills | Status: DC

## 2021-12-15 NOTE — Addendum Note (Signed)
Addended by: Denita Lung on: 12/15/2021 05:04 PM   Modules accepted: Orders

## 2021-12-15 NOTE — Progress Notes (Addendum)
   Subjective:    Patient ID: John Clay, male    DOB: 26-Mar-1960, 61 y.o.   MRN: [id number]  HPI He is here for recheck.  He had COVID in mid August and still had a residual cough and congestion after that that got much worse.  He was seen in the emergency room on September 15 and the evaluation including CT scan did show questionable pneumonia.  He was placed on Vibra-Tabs and doxycycline.  He states that he is roughly 75% better but still using the albuterol 3-4 times per day to help with the shortness of breath but then also notes shortness of breath when he walks up a flight of steps..  The scan also showed coronary artery calcification is well as aortic and mitral valve disease. Also of note is the fact that he has had previous radiation from his lymphoma from several years ago.  Review of Systems     Objective:   Physical Exam Alert and in no distress. Tympanic membranes and canals are normal. Pharyngeal area is normal. Neck is supple without adenopathy or thyromegaly. Cardiac exam shows a regular sinus rhythm with a 2/6 SEM heard best along the right sternal border, no gallops. Lungs are clear to auscultation.        Assessment & Plan:  Post-COVID-19 syndrome manifesting as chronic shortness of breath - Plan: DG Chest 2 View  Aortic valve calcification  Dyspnea on exertion - Plan: DG Chest 2 View He is scheduled to see Dr. Tamala Julian November 11.  I will see what the x-ray shows.  Might consider placing him on a steroid inhaler to help with his pulmonary symptoms although some of the sounds as if this could be cardiac especially the dyspnea on exertion and the aortic valvular calcification.   The x-ray is negative.

## 2021-12-17 ENCOUNTER — Telehealth: Payer: Self-pay | Admitting: Family Medicine

## 2021-12-17 MED ORDER — FLUTICASONE PROPIONATE HFA 44 MCG/ACT IN AERO: 2.0000 | INHALATION_SPRAY | Freq: Every day | RESPIRATORY_TRACT | 12 refills | Status: DC

## 2021-12-17 NOTE — Telephone Encounter (Signed)
Pt left message that Asmanex not covered,  I called pharmacy and Asmanex is not covered alternatives are Flovent, Pulmicort, or Qvar.  Can pt be switched to one of these?

## 2021-12-19 ENCOUNTER — Telehealth: Payer: Self-pay | Admitting: Family Medicine

## 2021-12-19 MED ORDER — PULMICORT FLEXHALER 180 MCG/ACT IN AEPB: 1.0000 | INHALATION_SPRAY | Freq: Two times a day (BID) | RESPIRATORY_TRACT | 12 refills | Status: DC

## 2021-12-19 NOTE — Telephone Encounter (Signed)
Pt left message still unable to get inhaler, called pharmacy & Fluticasone requires P.A. Called [facility] & Fluticasone is only covered if pt is under 62 years old.  Covered alternative is Pulmicort Flex inhaler, ok to switch per Audelia Acton.  Sent in new inhaler, went thru for $50, called pt and informed

## 2021-12-30 ENCOUNTER — Encounter: Payer: Self-pay | Admitting: Internal Medicine

## 2022-01-03 ENCOUNTER — Telehealth: Payer: Self-pay | Admitting: Family Medicine

## 2022-01-03 NOTE — Telephone Encounter (Signed)
P.A. TESTOSTERONE 30 MG 

## 2022-01-09 NOTE — Telephone Encounter (Signed)
P.A. approved til 01/03/23, sent my chart message

## 2022-01-12 ENCOUNTER — Encounter: Payer: Self-pay | Admitting: Internal Medicine

## 2022-02-02 ENCOUNTER — Ambulatory Visit (HOSPITAL_COMMUNITY): Payer: No Typology Code available for payment source | Attending: Interventional Cardiology

## 2022-02-02 DIAGNOSIS — I359 Nonrheumatic aortic valve disorder, unspecified: Secondary | ICD-10-CM | POA: Diagnosis present

## 2022-02-02 LAB — ECHOCARDIOGRAM COMPLETE
AR max vel: 1.29 cm2
AV Area VTI: 1.32 cm2
AV Area mean vel: 1.29 cm2
AV Mean grad: 12 mmHg
AV Peak grad: 23 mmHg
Ao pk vel: 2.4 m/s
Area-P 1/2: 4.17 cm2
Calc EF: 61.1 %
P 1/2 time: 404 msec
S' Lateral: 3 cm
Single Plane A2C EF: 62.5 %
Single Plane A4C EF: 62.8 %

## 2022-02-26 ENCOUNTER — Encounter: Payer: Self-pay | Admitting: Family Medicine

## 2022-03-03 NOTE — Progress Notes (Signed)
Cardiology Office Note:    Date:  03/04/2022   ID:  John Clay, DOB Dec 30, 1960, MRN [id number]  PCP:  Denita Lung, MD  Cardiologist:  None   Referring MD: Denita Lung, MD   Chief Complaint  Patient presents with   Follow-up    History of Hodgkin's lymphoma and mantle radiation greater than 30 years ago   Cardiac Valve Problem    Mild aortic stenosis   Coronary Artery Disease    Coronary calcification   Congestive Heart Failure    Grade 1 diastolic dysfunction   Hyperlipidemia   Hypertension    History of Present Illness:    John Clay is a 61 y.o. male with a hx of nodular sclerosing Hodgkin's lymphoma treated with sternal mantle radiation therapy in 1983, spleenectomy, elevated CAC score (47 in 04/2020), minimal carotid plaque, hypothyroidism, primary hypertension, grade 1 diastolic dysfunction, RBBB/LAHB with 1 degree AVB,  and aortic valve calcification.    John Clay has had COVID 19 twice.  Most recent episode was late summer 2023.  Since then he has had dyspnea.  He has noticed over time that there is a moderate degree of dyspnea on exertion.  It does not interfere with typical activities.  He still works out.  He feels more short of breath now with the same activity than he did a year ago (not exactly expressed in these terms but my surmise of our conversation).  Does not want to be on more medications if he can help it.  He denies angina pectoris, orthopnea, tachycardia/palpitations, syncope and near syncope, lower extremity edema, and claudication.  Past Medical History:  Diagnosis Date   Allergic rhinitis    Heart palpitations    Hodgkin's lymphoma ([place]) 03/23/1981   Treated with radiation therapy   Hyperlipidemia    Hypertension    Hypothyroidism    Secondary to radiation therapy    Past Surgical History:  Procedure Laterality Date   SPLENECTOMY      Current Medications: Current Meds  Medication Sig   albuterol (VENTOLIN HFA)  108 (90 Base) MCG/ACT inhaler Inhale 2 puffs into the lungs every 4 (four) hours as needed for wheezing or shortness of breath.   amLODipine (NORVASC) 10 MG tablet Take 1 tablet (10 mg total) by mouth daily.   budesonide (PULMICORT FLEXHALER) 180 MCG/ACT inhaler Inhale 1 puff into the lungs 2 (two) times daily.   levothyroxine (SYNTHROID) 88 MCG tablet Take 1 tablet (88 mcg total) by mouth daily.   losartan (COZAAR) 25 MG tablet Take 1 tablet (25 mg total) by mouth daily.   metoprolol succinate (TOPROL-XL) 50 MG 24 hr tablet TAKE 1 TABLET BY MOUTH DAILY WITH OR IMMEDIATELY FOLLOWING A MEAL.   Multiple Vitamin (MULTI-VITAMINS) TABS Take 1 tablet by mouth daily.   rosuvastatin (CRESTOR) 40 MG tablet Take 1 tablet (40 mg total) by mouth daily.   Testosterone 30 MG/ACT SOLN APPLY 1 PUMP TO EACH ARM DAILY     Allergies:   Other   Social History   Socioeconomic History   Marital status: Married    Spouse name: Not on file   Number of children: 1   Years of education: Not on file   Highest education level: Not on file  Occupational History   Not on file  Tobacco Use   Smoking status: Some Days    Types: Cigars   Smokeless tobacco: Never  Vaping Use   Vaping Use: Never used  Substance and Sexual  Activity   Alcohol use: Not Currently    Alcohol/week: 4.0 standard drinks of alcohol    Types: 4 Shots of liquor per week    Comment: occ   Drug use: No   Sexual activity: Yes  Other Topics Concern   Not on file  Social History Narrative   Not on file   Social Determinants of Health   Financial Resource Strain: Not on file  Food Insecurity: Not on file  Transportation Needs: Not on file  Physical Activity: Not on file  Stress: Not on file  Social Connections: Not on file     Family History: The patient's family history includes Healthy in his mother; Hypertension in his father; Kidney disease in his father.  ROS:   Please see the history of present illness.    Sleeps well.   Appetite is stable.  All other systems reviewed and are negative.  EKGs/Labs/Other Studies Reviewed:    The following studies were reviewed today:  2 D Doppler ECHOCARDIOGRAM 02/02/2022: IMPRESSIONS   1. Left ventricular ejection fraction, by estimation, is 60 to 65%. The  left ventricle has normal function. The left ventricle has no regional  wall motion abnormalities. Left ventricular diastolic parameters are  consistent with Grade I diastolic  dysfunction (impaired relaxation).   2. Right ventricular systolic function is normal. The right ventricular  size is normal. There is normal pulmonary artery systolic pressure.   3. The mitral valve is normal in structure. No evidence of mitral valve  regurgitation. No evidence of mitral stenosis.   4. The aortic valve is tricuspid. There is moderate calcification of the  aortic valve. There is moderate thickening of the aortic valve. Aortic  valve regurgitation is moderate. Mild aortic valve stenosis. Aortic  regurgitation PHT measures 404 msec.  Aortic valve area, by VTI measures 1.32 cm. Aortic valve mean gradient  measures 12.0 mmHg. Aortic valve Vmax measures 2.40 m/s.   5. The inferior vena cava is normal in size with greater than 50%  respiratory variability, suggesting right atrial pressure of 3 mmHg.   Comparison(s): Prior images reviewed side by side. Prior AV mean gradient  8 mmHg.    EKG:  EKG right bundle, left anterior hemiblock, first-degree AV block with PR interval 214 ms, consistent with trifascicular block and not significantly different than an EKG from 1 year ago but with a slightly longer PR interval increasing from 203 ms.  Recent Labs: 08/11/2021: ALT 39; TSH 1.040 12/05/2021: B Natriuretic Peptide 33.4; BUN 14; Creatinine, Ser 1.24; Hemoglobin 14.6; Platelets 229; Potassium 3.9; Sodium 138  Recent Lipid Panel    Component Value Date/Time   CHOL 96 (L) 08/11/2021 1453   TRIG 56 08/11/2021 1453   HDL 55  08/11/2021 1453   CHOLHDL 1.7 08/11/2021 1453   CHOLHDL 2.9 03/20/2016 0830   VLDL 12 03/20/2016 0830   LDLCALC 28 08/11/2021 1453    Physical Exam:    VS:  BP 120/68   Pulse 75   Ht '6\' 3"'$  (1.905 m)   Wt 225 lb 9.6 oz (102.3 kg)   SpO2 100%   BMI 28.20 kg/m     Wt Readings from Last 3 Encounters:  03/04/22 225 lb 9.6 oz (102.3 kg)  12/15/21 221 lb 12.8 oz (100.6 kg)  12/05/21 216 lb (98 kg)    I repeated the blood pressure and it was 138/78 mmHg. GEN: Appears younger than stated age. No acute distress HEENT: Normal NECK: No JVD. LYMPHATICS: No lymphadenopathy CARDIAC:  3/6 crescendo decrescendo systolic murmur of aortic stenosis with grade 1/6 to 2/6 decrescendo diastolic aortic regurgitation murmur. RRR S4 but no S3 gallop, or edema. VASCULAR:  Normal Pulses. No bruits. RESPIRATORY:  Clear to auscultation without rales, wheezing or rhonchi  ABDOMEN: Soft, non-tender, non-distended, No pulsatile mass, MUSCULOSKELETAL: No deformity  SKIN: Warm and dry NEUROLOGIC:  Alert and oriented x 3 PSYCHIATRIC:  Normal affect   ASSESSMENT:    1. Calcification of native coronary artery   2. Mild aortic stenosis   3. Grade I diastolic dysfunction   4. Essential hypertension   5. Mixed hyperlipidemia   6. Trifascicular block   7. History of Hodgkin's lymphoma    PLAN:    In order of problems listed above:  Asymptomatic coronary artery disease.  Continue high intensity statin therapy.  Consider addition of antiplatelet therapy, aspirin 81 mg 3 times per week.  Preventive therapy reviewed. Aortic valve disease has progressed to mild aortic stenosis from calcified aortic valve with normal hemodynamics over the past year.  We discussed the natural history.  Needs an echocardiogram repeated in 1 year. Dyspnea on exertion may have a component of diastolic heart failure.  We discussed SGLT2 therapy.  Will hold for now. He routinely gets systolic blood pressure recordings greater than 140  at home and frequently in the 150 mmHg range.  Increased losartan to 50 mg/day. Continue high intensity rosuvastatin 40 mg/day.  If the next lipid panel reveals an LDL less than 30 we should consider cutting back to 20 mg/day. Stable but with slight increase in first-degree AV block.  No action item.  We did discuss the implication of having calcific aortic and mitral valve disease and the potential relationship with conduction abnormality.  He knows to notify us of sudden reduction in energy, syncope, or notices significant decrease in heart rate. Undoubtedly, mantle radiation has contributed to some of the cardiovascular issues noted  I believe Dr. Annia Belt will be able to take great care of attorney Javid.  Recommended he sees Mr. Ninfa Linden in 1 year with a preceding echocardiogram.   Medication Adjustments/Labs and Tests Ordered: Current medicines are reviewed at length with the patient today.  Concerns regarding medicines are outlined above.  Orders Placed This Encounter  Procedures   EKG 12-Lead   ECHOCARDIOGRAM COMPLETE   No orders of the defined types were placed in this encounter.   Patient Instructions  Medication Instructions:  Your physician recommends that you continue on your current medications as directed. Please refer to the Current Medication list given to you today.  *If you need a refill on your cardiac medications before your next appointment, please call your pharmacy*  Testing/Procedures: Your physician has requested that you have an echocardiogram in November 2024. Echocardiography is a painless test that uses sound waves to create images of your heart. It provides your doctor with information about the size and shape of your heart and how well your heart's chambers and valves are working. This procedure takes approximately one hour. There are no restrictions for this procedure. Please do NOT wear cologne, perfume, aftershave, or lotions (deodorant is  allowed). Please arrive 15 minutes prior to your appointment time.  Follow-Up: At [facility], you and your health needs are our priority.  As part of our continuing mission to provide you with exceptional heart care, we have created designated Provider Care Teams.  These Care Teams include your primary Cardiologist (physician) and Advanced Practice Providers (APPs -  Physician Assistants  and Nurse Practitioners) who all work together to provide you with the care you need, when you need it.  Your next appointment:   12 month(s)  The format for your next appointment:   In Person  Provider:   Rudean Haskell, MD  Important Information About Sugar         Signed, Sinclair Grooms, MD  03/04/2022 12:42 PM    [place]

## 2022-03-04 ENCOUNTER — Ambulatory Visit
Payer: No Typology Code available for payment source | Attending: Interventional Cardiology | Admitting: Interventional Cardiology

## 2022-03-04 ENCOUNTER — Encounter: Payer: Self-pay | Admitting: Interventional Cardiology

## 2022-03-04 VITALS — BP 120/68 | HR 75 | Ht 75.0 in | Wt 225.6 lb

## 2022-03-04 DIAGNOSIS — I2510 Atherosclerotic heart disease of native coronary artery without angina pectoris: Secondary | ICD-10-CM

## 2022-03-04 DIAGNOSIS — I453 Trifascicular block: Secondary | ICD-10-CM

## 2022-03-04 DIAGNOSIS — E782 Mixed hyperlipidemia: Secondary | ICD-10-CM

## 2022-03-04 DIAGNOSIS — I10 Essential (primary) hypertension: Secondary | ICD-10-CM | POA: Diagnosis not present

## 2022-03-04 DIAGNOSIS — R079 Chest pain, unspecified: Secondary | ICD-10-CM

## 2022-03-04 DIAGNOSIS — I5189 Other ill-defined heart diseases: Secondary | ICD-10-CM

## 2022-03-04 DIAGNOSIS — I359 Nonrheumatic aortic valve disorder, unspecified: Secondary | ICD-10-CM | POA: Diagnosis not present

## 2022-03-04 DIAGNOSIS — Z8571 Personal history of Hodgkin lymphoma: Secondary | ICD-10-CM

## 2022-03-04 DIAGNOSIS — I2584 Coronary atherosclerosis due to calcified coronary lesion: Secondary | ICD-10-CM

## 2022-03-04 NOTE — Patient Instructions (Signed)
Medication Instructions:  Your physician recommends that you continue on your current medications as directed. Please refer to the Current Medication list given to you today.  *If you need a refill on your cardiac medications before your next appointment, please call your pharmacy*  Testing/Procedures: Your physician has requested that you have an echocardiogram in November 2024. Echocardiography is a painless test that uses sound waves to create images of your heart. It provides your doctor with information about the size and shape of your heart and how well your heart's chambers and valves are working. This procedure takes approximately one hour. There are no restrictions for this procedure. Please do NOT wear cologne, perfume, aftershave, or lotions (deodorant is allowed). Please arrive 15 minutes prior to your appointment time.  Follow-Up: At [facility], you and your health needs are our priority.  As part of our continuing mission to provide you with exceptional heart care, we have created designated Provider Care Teams.  These Care Teams include your primary Cardiologist (physician) and Advanced Practice Providers (APPs -  Physician Assistants and Nurse Practitioners) who all work together to provide you with the care you need, when you need it.  Your next appointment:   12 month(s)  The format for your next appointment:   In Person  Provider:   Rudean Haskell, MD  Important Information About Sugar

## 2022-03-10 ENCOUNTER — Encounter: Payer: Self-pay | Admitting: Nurse Practitioner

## 2022-03-10 ENCOUNTER — Telehealth: Payer: No Typology Code available for payment source | Admitting: Nurse Practitioner

## 2022-03-10 VITALS — BP 182/91 | HR 113 | Temp 100.3°F | Ht 75.0 in | Wt 225.0 lb

## 2022-03-10 DIAGNOSIS — R6889 Other general symptoms and signs: Secondary | ICD-10-CM | POA: Diagnosis not present

## 2022-03-10 MED ORDER — PREDNISONE 20 MG PO TABS: ORAL_TABLET | ORAL | 0 refills | Status: DC

## 2022-03-10 MED ORDER — BENZONATATE 200 MG PO CAPS: 200.0000 mg | ORAL_CAPSULE | Freq: Three times a day (TID) | ORAL | 0 refills | Status: DC | PRN

## 2022-03-10 MED ORDER — OSELTAMIVIR PHOSPHATE 75 MG PO CAPS: 75.0000 mg | ORAL_CAPSULE | Freq: Two times a day (BID) | ORAL | 0 refills | Status: DC

## 2022-03-10 NOTE — Assessment & Plan Note (Signed)
At home COVID test negative x 2 with influenza like illness starting yesterday. Will send in tamiflu for treatment to avoid delays as opposed to having patient come in for flu testing in the office. Will also send tessalon for cough and prednisone to use only if respiratory symptoms become severe given his history and the upcoming holiday weekend. Supportive measures discussed with patient. We will plan to f/u if his symptoms fail to improve after the weekend.

## 2022-03-10 NOTE — Progress Notes (Signed)
Virtual Visit Encounter mychart visit.   I connected with  [name] on 03/10/22 at  3:00 PM EST by secure video and audio telemedicine application. I verified that I am speaking with the correct person using two identifiers.   I introduced myself as a Designer, jewellery with the practice. The limitations of evaluation and management by telemedicine discussed with the patient and the availability of in person appointments. The patient expressed verbal understanding and consent to proceed.  Participating parties in this visit include: Myself and patient  The patient is: Patient Location: Home I am: Provider Location: Office/Clinic Subjective:    CC and HPI: John Clay is a 61 y.o. year old male presenting for new evaluation and treatment of Flu-like symptoms. Patient reports the following: Sudden onset of cough, chills, fever, congestion, body aches yesterday with ongoing symptoms at this time. He reports he is extremely tired. He does have a history of respiratory issues and has been watching this carefully. COVID tests x2 negative. He did have the flu vaccine earlier this year.   Past medical history, Surgical history, Family history not pertinant except as noted below, Social history, Allergies, and medications have been entered into the medical record, reviewed, and corrections made.   Review of Systems:  All review of systems negative except what is listed in the HPI  Objective:    Alert and oriented x 4 Audible congestion.  Speaking in clear sentences with no shortness of breath. No distress.  Impression and Recommendations:    Problem List Items Addressed This Visit     Flu-like symptoms - Primary    At home COVID test negative x 2 with influenza like illness starting yesterday. Will send in tamiflu for treatment to avoid delays as opposed to having patient come in for flu testing in the office. Will also send tessalon for cough and prednisone to use only if  respiratory symptoms become severe given his history and the upcoming holiday weekend. Supportive measures discussed with patient. We will plan to f/u if his symptoms fail to improve after the weekend.       Relevant Medications   benzonatate (TESSALON) 200 MG capsule   predniSONE (DELTASONE) 20 MG tablet   oseltamivir (TAMIFLU) 75 MG capsule    orders and follow up as documented in EMR I discussed the assessment and treatment plan with the patient. The patient was provided an opportunity to ask questions and all were answered. The patient agreed with the plan and demonstrated an understanding of the instructions.   The patient was advised to call back or seek an in-person evaluation if the symptoms worsen or if the condition fails to improve as anticipated.  Follow-Up: prn  I provided 19 minutes of non-face-to-face interaction with this non face-to-face encounter including intake, same-day documentation, and chart review.   Orma Render, NP , DNP, AGNP-c [facility] [phone] (fax)

## 2022-04-15 DIAGNOSIS — M20012 Mallet finger of left finger(s): Secondary | ICD-10-CM | POA: Insufficient documentation

## 2022-05-09 ENCOUNTER — Other Ambulatory Visit: Payer: Self-pay | Admitting: Family Medicine

## 2022-05-09 DIAGNOSIS — E039 Hypothyroidism, unspecified: Secondary | ICD-10-CM

## 2022-05-13 LAB — HM COLONOSCOPY

## 2022-05-19 ENCOUNTER — Other Ambulatory Visit: Payer: Self-pay | Admitting: Family Medicine

## 2022-05-19 DIAGNOSIS — E291 Testicular hypofunction: Secondary | ICD-10-CM

## 2022-05-25 ENCOUNTER — Telehealth: Payer: Self-pay | Admitting: Family Medicine

## 2022-05-25 ENCOUNTER — Emergency Department (HOSPITAL_BASED_OUTPATIENT_CLINIC_OR_DEPARTMENT_OTHER): Payer: No Typology Code available for payment source | Admitting: Radiology

## 2022-05-25 ENCOUNTER — Other Ambulatory Visit: Payer: Self-pay

## 2022-05-25 ENCOUNTER — Encounter (HOSPITAL_BASED_OUTPATIENT_CLINIC_OR_DEPARTMENT_OTHER): Payer: Self-pay | Admitting: Emergency Medicine

## 2022-05-25 ENCOUNTER — Emergency Department (HOSPITAL_BASED_OUTPATIENT_CLINIC_OR_DEPARTMENT_OTHER)
Admission: EM | Admit: 2022-05-25 | Discharge: 2022-05-25 | Disposition: A | Discharge status: Home / Self Care | Payer: No Typology Code available for payment source | Attending: Emergency Medicine | Admitting: Emergency Medicine

## 2022-05-25 ENCOUNTER — Emergency Department (HOSPITAL_BASED_OUTPATIENT_CLINIC_OR_DEPARTMENT_OTHER): Payer: No Typology Code available for payment source

## 2022-05-25 DIAGNOSIS — R011 Cardiac murmur, unspecified: Secondary | ICD-10-CM | POA: Insufficient documentation

## 2022-05-25 DIAGNOSIS — Z1152 Encounter for screening for COVID-19: Secondary | ICD-10-CM | POA: Diagnosis not present

## 2022-05-25 DIAGNOSIS — R062 Wheezing: Secondary | ICD-10-CM | POA: Insufficient documentation

## 2022-05-25 DIAGNOSIS — Z79899 Other long term (current) drug therapy: Secondary | ICD-10-CM | POA: Diagnosis not present

## 2022-05-25 DIAGNOSIS — R0602 Shortness of breath: Secondary | ICD-10-CM | POA: Diagnosis present

## 2022-05-25 DIAGNOSIS — R6883 Chills (without fever): Secondary | ICD-10-CM | POA: Diagnosis not present

## 2022-05-25 DIAGNOSIS — R6889 Other general symptoms and signs: Secondary | ICD-10-CM

## 2022-05-25 LAB — COMPREHENSIVE METABOLIC PANEL
ALT: 30 U/L (ref 0–44)
AST: 25 U/L (ref 15–41)
Albumin: 4 g/dL (ref 3.5–5.0)
Alkaline Phosphatase: 55 U/L (ref 38–126)
Anion gap: 8 (ref 5–15)
BUN: 12 mg/dL (ref 8–23)
CO2: 24 mmol/L (ref 22–32)
Calcium: 9.3 mg/dL (ref 8.9–10.3)
Chloride: 106 mmol/L (ref 98–111)
Creatinine, Ser: 1.08 mg/dL (ref 0.61–1.24)
GFR, Estimated: >60 mL/min (ref >60)
Glucose, Bld: 103 mg/dL — ABNORMAL HIGH (ref 70–99)
Potassium: 3.9 mmol/L (ref 3.5–5.1)
Sodium: 138 mmol/L (ref 135–145)
Total Bilirubin: 0.4 mg/dL (ref 0.3–1.2)
Total Protein: 7 g/dL (ref 6.5–8.1)

## 2022-05-25 LAB — CBC WITH DIFFERENTIAL/PLATELET
Abs Immature Granulocytes: 0.02 10*3/uL (ref 0.00–0.07)
Basophils Absolute: 0 10*3/uL (ref 0.0–0.1)
Basophils Relative: 1 %
Eosinophils Absolute: 0.3 10*3/uL (ref 0.0–0.5)
Eosinophils Relative: 4 %
HCT: 44.1 % (ref 39.0–52.0)
Hemoglobin: 13.9 g/dL (ref 13.0–17.0)
Immature Granulocytes: 0 %
Lymphocytes Relative: 24 %
Lymphs Abs: 1.6 10*3/uL (ref 0.7–4.0)
MCH: 24.4 pg — ABNORMAL LOW (ref 26.0–34.0)
MCHC: 31.5 g/dL (ref 30.0–36.0)
MCV: 77.5 fL — ABNORMAL LOW (ref 80.0–100.0)
Monocytes Absolute: 0.6 10*3/uL (ref 0.1–1.0)
Monocytes Relative: 9 %
Neutro Abs: 4 10*3/uL (ref 1.7–7.7)
Neutrophils Relative %: 62 %
Platelets: 201 10*3/uL (ref 150–400)
RBC: 5.69 MIL/uL (ref 4.22–5.81)
RDW: 17.2 % — ABNORMAL HIGH (ref 11.5–15.5)
WBC: 6.5 10*3/uL (ref 4.0–10.5)
nRBC: 0 % (ref 0.0–0.2)

## 2022-05-25 LAB — TROPONIN I (HIGH SENSITIVITY): Troponin I (High Sensitivity): 12 ng/L (ref <18)

## 2022-05-25 LAB — RESP PANEL BY RT-PCR (RSV, FLU A&B, COVID)  RVPGX2
Influenza A by PCR: NEGATIVE
Influenza B by PCR: NEGATIVE
Resp Syncytial Virus by PCR: NEGATIVE
SARS Coronavirus 2 by RT PCR: NEGATIVE

## 2022-05-25 LAB — BRAIN NATRIURETIC PEPTIDE: B Natriuretic Peptide: 29.2 pg/mL (ref 0.0–100.0)

## 2022-05-25 MED ORDER — PREDNISONE 20 MG PO TABS: ORAL_TABLET | ORAL | 0 refills | Status: DC

## 2022-05-25 MED ORDER — ALBUTEROL SULFATE HFA 108 (90 BASE) MCG/ACT IN AERS: 2.0000 | INHALATION_SPRAY | RESPIRATORY_TRACT | 0 refills | Status: DC | PRN

## 2022-05-25 MED ORDER — METHYLPREDNISOLONE SODIUM SUCC 125 MG IJ SOLR
125.0000 mg | Freq: Once | INTRAMUSCULAR | Status: AC
  Administered 2022-05-25: 125 mg via INTRAVENOUS
  Filled 2022-05-25: qty 2

## 2022-05-25 MED ORDER — IPRATROPIUM-ALBUTEROL 0.5-2.5 (3) MG/3ML IN SOLN
3.0000 mL | RESPIRATORY_TRACT | Status: DC
  Administered 2022-05-25: 3 mL via RESPIRATORY_TRACT
  Filled 2022-05-25: qty 3

## 2022-05-25 MED ORDER — IPRATROPIUM-ALBUTEROL 0.5-2.5 (3) MG/3ML IN SOLN
3.0000 mL | Freq: Once | RESPIRATORY_TRACT | Status: AC
  Administered 2022-05-25: 3 mL via RESPIRATORY_TRACT
  Filled 2022-05-25: qty 3

## 2022-05-25 MED ORDER — IOHEXOL 350 MG/ML SOLN
100.0000 mL | Freq: Once | INTRAVENOUS | Status: AC | PRN
  Administered 2022-05-25: 75 mL via INTRAVENOUS

## 2022-05-25 NOTE — ED Triage Notes (Signed)
Pt arrives to ED with c/o SOB. He notes DOE that started x3 days ago while walking to a basketball game. He notes SOB mostly on exertion. He has been using his inhaler at night. Associated with cough.

## 2022-05-25 NOTE — ED Notes (Signed)
Pt given discharge instructions and reviewed prescriptions. Opportunities given for questions. Pt verbalizes understanding. PIV removed x1. Leanne Chang, RN

## 2022-05-25 NOTE — ED Provider Notes (Signed)
[facility] Provider Note   CSN: [id number] Arrival date & time: 05/25/22  1414     History  Chief Complaint  Patient presents with   Shortness of Breath    NIVEDH BEITZ is a 62 y.o. male with asthma and aortic stenosis presented after being on exertion after pain basketball for the past 3 days.  Patient has when he is active, short of breath at rest shortness of breath resolves.  Patient states when he gets in a coughing fit he sometimes loses consciousness but does not fall or hit his head.  Patient denies chest pain, abdominal pain, nausea/vomiting, fevers,  Home Medications Prior to Admission medications   Medication Sig Start Date End Date Taking? Authorizing Provider  albuterol (VENTOLIN HFA) 108 (90 Base) MCG/ACT inhaler Inhale 2 puffs into the lungs every 4 (four) hours as needed for wheezing or shortness of breath. 05/25/22   Chuck Hint, PA-C  amLODipine (NORVASC) 10 MG tablet Take 1 tablet (10 mg total) by mouth daily. 08/11/21   Denita Lung, MD  benzonatate (TESSALON) 200 MG capsule Take 1 capsule (200 mg total) by mouth 3 (three) times daily as needed for cough. 03/10/22   Early, Coralee Pesa, NP  budesonide (PULMICORT FLEXHALER) 180 MCG/ACT inhaler Inhale 1 puff into the lungs 2 (two) times daily. 12/19/21   Denita Lung, MD  Dextromethorphan-guaiFENesin 5-100 MG/5ML LIQD Take 20 mLs by mouth every 4 (four) hours.    [provider]  DM-APAP-CPM (CORICIDIN HBP PO) Take 2 tablets by mouth every 4 (four) hours.    [provider]  levothyroxine (SYNTHROID) 88 MCG tablet TAKE 1 TABLET BY MOUTH EVERY DAY 05/11/22   Denita Lung, MD  losartan (COZAAR) 25 MG tablet Take 1 tablet (25 mg total) by mouth daily. 08/11/21   Denita Lung, MD  metoprolol succinate (TOPROL-XL) 50 MG 24 hr tablet TAKE 1 TABLET BY MOUTH DAILY WITH OR IMMEDIATELY FOLLOWING A MEAL. 10/28/21   Denita Lung, MD  Multiple Vitamin  (MULTI-VITAMINS) TABS Take 1 tablet by mouth daily.    [provider]  oseltamivir (TAMIFLU) 75 MG capsule Take 1 capsule (75 mg total) by mouth 2 (two) times daily. 03/10/22   Orma Render, NP  predniSONE (DELTASONE) 20 MG tablet Take '60mg'$  PO daily x 2 days, then'40mg'$  PO daily x 2 days, then '20mg'$  PO daily x 3 days 05/25/22   Lurena Nida T, PA-C  rosuvastatin (CRESTOR) 40 MG tablet Take 1 tablet (40 mg total) by mouth daily. 08/11/21   Denita Lung, MD  Testosterone 30 MG/ACT SOLN APPLY 1 PUMP TO EACH ARM DAILY 05/19/22   Denita Lung, MD      Allergies    Other    Review of Systems   Review of Systems  Respiratory:  Positive for shortness of breath.   See HPI  Physical Exam Updated Vital Signs BP (!) 140/75   Pulse 89   Temp 98.1 F (36.7 C)   Resp 20   Ht '6\' 3"'$  (1.905 m)   Wt 102.1 kg   SpO2 100%   BMI 28.12 kg/m  Physical Exam Vitals and nursing note reviewed.  Constitutional:      General: He is not in acute distress.    Appearance: He is well-developed.  HENT:     Head: Normocephalic and atraumatic.  Eyes:     Extraocular Movements: Extraocular movements intact.     Conjunctiva/sclera:  Conjunctivae normal.     Pupils: Pupils are equal, round, and reactive to light.  Neck:     Vascular: No carotid bruit.  Cardiovascular:     Rate and Rhythm: Normal rate and regular rhythm.     Pulses: Normal pulses.     Heart sounds: Murmur (2/6 aortic stenosis) heard.     Comments: 2+ bilateral radial/posterior tibialis pulses with regular rate Pulmonary:     Effort: Pulmonary effort is normal. No respiratory distress.     Breath sounds: Wheezing present.  Abdominal:     Palpations: Abdomen is soft.     Tenderness: There is no abdominal tenderness. There is no guarding or rebound.  Musculoskeletal:        General: No swelling. Normal range of motion.     Cervical back: Normal range of motion and neck supple.     Comments: 5 out of 5 bilateral grip strength   Skin:    General: Skin is warm and dry.     Capillary Refill: Capillary refill takes less than 2 seconds.  Neurological:     General: No focal deficit present.     Mental Status: He is alert and oriented to person, place, and time.  Psychiatric:        Mood and Affect: Mood normal.     ED Results / Procedures / Treatments   Labs (all labs ordered are listed, but only abnormal results are displayed) Labs Reviewed  CBC WITH DIFFERENTIAL/PLATELET - Abnormal; Notable for the following components:      Result Value   MCV 77.5 (*)    MCH 24.4 (*)    RDW 17.2 (*)    All other components within normal limits  COMPREHENSIVE METABOLIC PANEL - Abnormal; Notable for the following components:   Glucose, Bld 103 (*)    All other components within normal limits  RESP PANEL BY RT-PCR (RSV, FLU A&B, COVID)  [id number]  BRAIN NATRIURETIC PEPTIDE  TROPONIN I (HIGH SENSITIVITY)    EKG EKG Interpretation  Date/Time:  Monday May 25 2022 14:38:52 EST Ventricular Rate:  89 PR Interval:  190 QRS Duration: 132 QT Interval:  382 QTC Calculation: 464 R Axis:   -69 Text Interpretation: Normal sinus rhythm Right bundle branch block Left anterior fascicular block Minimal voltage criteria for LVH, may be normal variant ( R in aVL ) Abnormal ECG No significant change since last tracing Confirmed by Regan Lemming ([id number]) on 05/25/2022 2:44:53 PM  Radiology CT Angio Chest PE W/Cm &/Or Wo Cm  Result Date: 05/25/2022 CLINICAL DATA:  Shortness of breath EXAM: CT ANGIOGRAPHY CHEST WITH CONTRAST TECHNIQUE: Multidetector CT imaging of the chest was performed using the standard protocol during bolus administration of intravenous contrast. Multiplanar CT image reconstructions and MIPs were obtained to evaluate the vascular anatomy. RADIATION DOSE REDUCTION: This exam was performed according to the departmental dose-optimization program which includes automated exposure control, adjustment of the mA and/or kV according  to patient size and/or use of iterative reconstruction technique. CONTRAST:  93m OMNIPAQUE IOHEXOL 350 MG/ML SOLN COMPARISON:  12/05/2021 FINDINGS: Cardiovascular: Satisfactory opacification of the bilateral pulmonary arteries to the lobar level. No evidence of pulmonary embolism. Although not tailored for evaluation of the thoracic aorta, there is no evidence of thoracic aortic aneurysm or dissection. Atherosclerotic calcifications of the aortic root/arch. The heart is normal in size. No pericardial effusion. Mitral valve annular calcifications. Mediastinum/Nodes: No suspicious mediastinal lymphadenopathy. Visualized thyroid is unremarkable. Lungs/Pleura: Mild dependent atelectasis in the posterior right upper  lobe and bilateral lower lobes. No focal consolidation. No suspicious pulmonary nodules. No pleural effusion or pneumothorax. Upper Abdomen: Visualized upper abdomen is notable for a 13 mm simple cyst in the posterior interpolar left kidney (series 4/image 167), benign (Bosniak I). No follow-up is recommended. Vascular calcifications. Musculoskeletal: Visualized osseous structures are within normal limits. Review of the MIP images confirms the above findings. IMPRESSION: No evidence of pulmonary embolism. Negative CT chest. Aortic Atherosclerosis (ICD10-I70.0). Electronically Signed   By: Julian Hy M.D.   On: 05/25/2022 17:40   DG Chest 2 View  Result Date: 05/25/2022 CLINICAL DATA:  Dyspnea with exertion. EXAM: CHEST - 2 VIEW COMPARISON:  December 15, 2021. FINDINGS: The heart size and mediastinal contours are within normal limits. Both lungs are clear. The visualized skeletal structures are unremarkable. IMPRESSION: No active cardiopulmonary disease. Electronically Signed   By: Marijo Conception M.D.   On: 05/25/2022 15:02    Procedures Procedures    Medications Ordered in ED Medications  ipratropium-albuterol (DUONEB) 0.5-2.5 (3) MG/3ML nebulizer solution 3 mL (3 mLs Nebulization Given  05/25/22 1652)  ipratropium-albuterol (DUONEB) 0.5-2.5 (3) MG/3ML nebulizer solution 3 mL (3 mLs Nebulization Given 05/25/22 1438)  methylPREDNISolone sodium succinate (SOLU-MEDROL) 125 mg/2 mL injection 125 mg (125 mg Intravenous Given 05/25/22 1720)  iohexol (OMNIPAQUE) 350 MG/ML injection 100 mL (75 mLs Intravenous Contrast Given 05/25/22 1707)    ED Course/ Medical Decision Making/ A&P Clinical Course as of 05/25/22 Cira Rue May 25, 2022  1615 This is a 62 year old male presenting to ED with dyspnea on exertion.  Patient reports symptoms been worsening for the past several days, although he does have a longer history of this per my review of external records.  He denies chest pressure, reports a mild persistent cough.  Denies history of smoking or emphysema or COPD.  He was followed by cardiology and had an echo performed in November which I reviewed, with a normal EF, but noted to have moderate aortic regurgitation at the time.  He denies that he takes diuretics at home.  On exam the patient is well-appearing.  He is not hypoxic, tachycardic, tachypneic, has very mild symmetrical pitting edema of the lower extremities.  Patient feels that he symptomatically has some improvement after DuoNebs but does not have history of underlying pneumonia or emphysema.  I do note that in September the patient had normal or negative x-ray films was found to have a pneumonia on his CT scan of the chest.  Today his x-ray again does not show any focal infiltrate, but I do have a higher clinical suspicion for infection, and we will repeat a CT pulmonary angio study at this time. [MT]  [id number] EKG shows a persistent bifascicular block with no other acute ischemic findings. [MT]    Clinical Course User Index [MT] Trifan, Carola Rhine, MD                             Medical Decision Making Amount and/or Complexity of Data Reviewed Labs: ordered. Radiology: ordered.  Risk Prescription drug management.   Rebel Wurtz Pilling  62 y.o. presented today for dyspnea on exertion. Working DDx that I considered at this time includes, but not limited to, aortic stenosis, asthma exacerbation, COPD, ACS, CHF.  Review of prior external notes: 03/04/2022 cardiology  Unique Tests and My Interpretation:  Chest x-ray: Hyperinflated lungs, no acute cardiopulmonary changes Respiratory panel: Negative CMP: Unremarkable CBC: Unremarkable Troponin: 12  BNP: 29.2 EKG: Persistent bifascicular block with no new changes CTA PE: Negative for PE or abnormalities  Discussion with Independent Historian: none  Discussion of Management of Tests: none  Risk:    Medium:  - prescription drug management  Risk Stratification Score: None  Staffed with Michel Santee, MD  R/o DDx: CHF exacerbation: BNP is not elevated ACS: EKG and troponin are negative   Plan: Patient presented for dyspnea on exertion.  Patient did not appear to be in distress and has stable vitals during physical exam.  Patient received DuoNeb treatment before it entered the room however patient was still slightly wheezy.  Aortic stenosis was auscultated.  Labs and imaging were ordered.  I suspect the patient's dyspnea on exertion is related to the patient's aortic stenosis as he has had this concern in the past.  Patient stable at this time.  CTA will be ordered to evaluate for possible PE.  CTA came back negative for PE or pneumonia.  On exam patient was still slightly wheezy however had stable vitals and did not endorse any dyspnea.  Patient be discharged with an albuterol inhaler and prednisone taper and encouraged to follow-up with his primary care provider and monitor symptoms.  Patient is stable vitals.  Patient was given return precautions.patient stable for discharge at this time.  Patient verbalized understanding of plan.         Final Clinical Impression(s) / ED Diagnoses Final diagnoses:  Flu-like symptoms    Rx / DC Orders ED Discharge Orders           Ordered    albuterol (VENTOLIN HFA) 108 (90 Base) MCG/ACT inhaler  Every 4 hours PRN        05/25/22 1834    predniSONE (DELTASONE) 20 MG tablet        05/25/22 1835              Elvina Sidle 05/25/22 1848    Wyvonnia Dusky, MD 05/25/22 [phone]

## 2022-05-25 NOTE — ED Notes (Signed)
Patient verbalizes understanding of discharge instructions. Opportunity for questioning and answers were provided. Patient discharged from ED.  °

## 2022-05-25 NOTE — Telephone Encounter (Signed)
Pt called & having wheezing and shortness of breath, no appts here today, so he is going to [facility] & he wanted me to let you know

## 2022-05-25 NOTE — Discharge Instructions (Signed)
Pick up the albuterol inhaler and steroids I have prescribed for you.  Please make an appointment with your primary care provider to be seen regarding recent ER visit and symptoms.  Please monitor your symptoms and if they worsen return to the ER.

## 2022-05-27 ENCOUNTER — Ambulatory Visit: Payer: No Typology Code available for payment source | Admitting: Family Medicine

## 2022-05-27 ENCOUNTER — Encounter: Payer: Self-pay | Admitting: Family Medicine

## 2022-05-27 VITALS — BP 138/70 | HR 66 | Temp 98.2°F | Wt 227.6 lb

## 2022-05-27 DIAGNOSIS — E291 Testicular hypofunction: Secondary | ICD-10-CM | POA: Diagnosis not present

## 2022-05-27 DIAGNOSIS — Z125 Encounter for screening for malignant neoplasm of prostate: Secondary | ICD-10-CM | POA: Diagnosis not present

## 2022-05-27 NOTE — Progress Notes (Signed)
   Subjective:    Patient ID: John Clay, male    DOB: Nov 02, 1960, 62 y.o.   MRN: [id number]  HPI He is here for recheck on his testosterone.  He has not had 1 in several months.  He states that his energy, stamina and strength appear normal.   Review of Systems     Objective:   Physical Exam Alert and in no distress otherwise not examined       Assessment & Plan:  Hypogonadism in male - Plan: PSA, Testosterone  Screening for prostate cancer - Plan: PSA We then briefly discussed his cardiovascular risk.  He recently saw Dr. Tamala Julian who is about to retire.  Presently he is on appropriate blood pressure medications as well as statin drug.

## 2022-05-28 ENCOUNTER — Encounter: Payer: Self-pay | Admitting: Family Medicine

## 2022-05-28 LAB — TESTOSTERONE: Testosterone: 227 ng/dL — ABNORMAL LOW (ref 264–916)

## 2022-05-28 LAB — PSA: Prostate Specific Ag, Serum: 0.9 ng/mL (ref 0.0–4.0)

## 2022-06-27 ENCOUNTER — Other Ambulatory Visit: Payer: Self-pay | Admitting: Family Medicine

## 2022-06-27 DIAGNOSIS — E291 Testicular hypofunction: Secondary | ICD-10-CM

## 2022-06-27 DIAGNOSIS — I10 Essential (primary) hypertension: Secondary | ICD-10-CM

## 2022-06-29 ENCOUNTER — Encounter: Payer: Self-pay | Admitting: Family Medicine

## 2022-06-29 MED ORDER — ALBUTEROL SULFATE HFA 108 (90 BASE) MCG/ACT IN AERS: 2.0000 | INHALATION_SPRAY | RESPIRATORY_TRACT | 0 refills | Status: DC | PRN

## 2022-06-29 NOTE — Telephone Encounter (Signed)
Called patient and he did not need the metoprolol, but does need the testosterone refilled. Has appt in June.

## 2022-07-14 ENCOUNTER — Encounter: Payer: Self-pay | Admitting: Family Medicine

## 2022-07-14 ENCOUNTER — Ambulatory Visit: Payer: No Typology Code available for payment source | Admitting: Family Medicine

## 2022-07-14 VITALS — BP 144/72 | HR 111 | Temp 97.9°F | Resp 18 | Wt 226.0 lb

## 2022-07-14 DIAGNOSIS — J45909 Unspecified asthma, uncomplicated: Secondary | ICD-10-CM

## 2022-07-14 DIAGNOSIS — I350 Nonrheumatic aortic (valve) stenosis: Secondary | ICD-10-CM

## 2022-07-14 DIAGNOSIS — I351 Nonrheumatic aortic (valve) insufficiency: Secondary | ICD-10-CM | POA: Diagnosis not present

## 2022-07-14 DIAGNOSIS — Z8571 Personal history of Hodgkin lymphoma: Secondary | ICD-10-CM | POA: Diagnosis not present

## 2022-07-14 NOTE — Progress Notes (Signed)
   Subjective:    Patient ID: John Clay, male    DOB: Jan 04, 1961, 62 y.o.   MRN: [id number]  HPI He is here for discussion of difficulty with breathing.  He was seen in the emergency room on March 4 and evaluated for difficulty with breathing.  He was given a steroid as well as albuterol and did fairly well on it until early April and at that point was exposed to cold weather and developed chest congestion.  He used albuterol again and found it quite useful but has not been using it 4 times per day.  It has been especially bad in the last 4 days with coughing and wheezing with some shortness of breath but no fever, chills, earache.  He does have a previous history of Hodgkin's disease with radiation.  He has concerns over the possibility of this creating some of the issue.  He also has a history of AAS/AR and is followed by cardiology.   Review of Systems     Objective:   Physical Exam Alert and in no distress. Tympanic membranes and canals are normal. Pharyngeal area is normal. Neck is supple without adenopathy or thyromegaly. Cardiac exam shows a regular sinus rhythm 3-4/SEM gallops. Lungs show expiratory wheezing        Assessment & Plan:  Mild asthma, unspecified whether complicated, unspecified whether persistent - Plan: Ambulatory referral to Pulmonology  Nonrheumatic aortic valve stenosis  Nonrheumatic aortic valve insufficiency  History of Hodgkin's lymphoma A sample of Airsupra given limited instructions on use.  I would like to have him seen by pulmonary to evaluate his asthma and potential complication of having the Hodgkin's and subsequent radiation causing lung issues.

## 2022-07-20 ENCOUNTER — Other Ambulatory Visit: Payer: Self-pay | Admitting: Medical

## 2022-07-20 ENCOUNTER — Encounter: Payer: Self-pay | Admitting: Family Medicine

## 2022-07-20 MED ORDER — AIRSUPRA 90-80 MCG/ACT IN AERO: 2.0000 | INHALATION_SPRAY | Freq: Four times a day (QID) | RESPIRATORY_TRACT | 0 refills | Status: DC | PRN

## 2022-07-27 ENCOUNTER — Other Ambulatory Visit: Payer: Self-pay | Admitting: Family Medicine

## 2022-08-05 ENCOUNTER — Ambulatory Visit: Payer: No Typology Code available for payment source | Admitting: Pulmonary Disease

## 2022-08-05 ENCOUNTER — Encounter: Payer: Self-pay | Admitting: Pulmonary Disease

## 2022-08-05 VITALS — BP 118/60 | HR 84 | Ht 75.0 in | Wt 221.8 lb

## 2022-08-05 DIAGNOSIS — J4540 Moderate persistent asthma, uncomplicated: Secondary | ICD-10-CM

## 2022-08-05 MED ORDER — AIRSUPRA 90-80 MCG/ACT IN AERO: 2.0000 | INHALATION_SPRAY | Freq: Four times a day (QID) | RESPIRATORY_TRACT | 5 refills | Status: DC | PRN

## 2022-08-05 NOTE — Patient Instructions (Addendum)
It is nice to meet you  I suspect your asthma is reactivated after COVID infection, pneumonia, in the fall 2023.  I am glad with time symptoms have improved.  Since the air supra seems to be working quite well at night, continue to use at night.  Please rinse your mouth out after every use  I sent a prescription for this as it looks like your current prescription has no refills.  I provided a co-pay card, lets see if we can reduce the cost as low as possible with this.  If this strategy with a co-pay card does not work, let me know and I can prescribe an alternative inhaler to use in the future.  Return to clinic in 3 months or sooner as needed with Dr. Judeth Horn

## 2022-08-05 NOTE — Progress Notes (Signed)
@Patient  ID: John Clay, male    DOB: November 17, 1960, 62 y.o.   MRN: [id number]  Chief Complaint  Patient presents with   Consult    Cough, sob, at night and some during the day.    Referring provider: Ronnald Nian, MD  HPI:   62 y.o. man whom we are seeing for evaluation of asthma.  Most recent PCP note x 2 reviewed.  Diagnosed with asthma as a child.  Not a major issue in adulthood.  No breathing issues prior to fall 2023.  He contracted COVID 11/2021.  Worsening symptoms went to the ED.  Diagnosed with pneumonia based on result of CT scan.  Given antibiotics.  Since then his breathing has been an issue.  Throughout the last few months the daytime symptoms have gradually gone away.  His dyspnea on exertion is largely gone.  His primary issue is nocturnal symptoms.  Cough, wheeze, shortness of breath.  Albuterol was not very beneficial.  Recently, he was prescribed air supra or given samples of air supra.  This helped tremendously with nocturnal symptoms.  New prescription was sent in late April 2024.  He notes this is quite expensive.  However his nocturnal symptoms are essentially relieved with using this in the evening.  He had repeat CT scan 05/2022 on my review and interpretation reveals resolution of prior pneumonia, thickened bronchioles.  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  Lab Results: Personally  reviewed CBC    Component Value Date/Time   WBC 6.5 05/25/2022 1430   RBC 5.69 05/25/2022 1430   HGB 13.9 05/25/2022 1430   HGB 14.1 08/11/2021 1453   HCT 44.1 05/25/2022 1430   HCT 43.7 08/11/2021 1453   PLT 201 05/25/2022 1430   PLT 193 08/11/2021 1453   MCV 77.5 (L) 05/25/2022 1430   MCV 76 (L) 08/11/2021 1453   MCH 24.4 (L) 05/25/2022 1430   MCHC 31.5 05/25/2022 1430   RDW 17.2 (H) 05/25/2022 1430   RDW 15.0 08/11/2021 1453   LYMPHSABS 1.6 05/25/2022 1430   LYMPHSABS 1.7 08/11/2021 1453   MONOABS 0.6 05/25/2022 1430   EOSABS 0.3 05/25/2022 1430   EOSABS 0.2 08/11/2021 1453   BASOSABS 0.0 05/25/2022 1430   BASOSABS 0.1 08/11/2021 1453    BMET    Component Value Date/Time   NA 138 05/25/2022 1430   NA 139 08/11/2021 1453   K 3.9 05/25/2022 1430   CL 106 05/25/2022 1430   CO2 24 05/25/2022 1430   GLUCOSE 103 (H) 05/25/2022 1430   BUN 12 05/25/2022 1430   BUN 11 08/11/2021 1453   CREATININE 1.08 05/25/2022 1430  CALCIUM 9.3 05/25/2022 1430   GFRNONAA >60 05/25/2022 1430   GFRAA 76 01/10/2020 0940    BNP    Component Value Date/Time   BNP 29.2 05/25/2022 1609    ProBNP No results found for: "PROBNP"  Specialty Problems       Pulmonary Problems   OSA (obstructive sleep apnea)   Seasonal allergic rhinitis due to pollen    Allergies  Allergen Reactions   Other Other (See Comments)    ALLERGEN: Chlorpheniramine-phenylpropan  REACTIONS: tchy eyes, sneezing, stuffiness    Immunization History  Administered Date(s) Administered   Influenza Split 01/06/2012, 12/20/2012   Influenza, Quadrivalent, Recombinant, Inj, Pf 12/07/2017   Influenza,inj,Quad PF,6+ Mos 12/12/2019, 01/08/2021, 12/03/2021   Influenza-Unspecified 11/30/2016, 12/23/2016, 12/07/2017, 12/05/2018   PFIZER Comirnaty(Gray Top)Covid-19 Tri-Sucrose Vaccine 07/03/2020   PFIZER(Purple Top)SARS-COV-2 Vaccination 05/22/2019, 06/14/2019, 12/12/2019   Pfizer Covid-19 Vaccine Bivalent Booster  56yrs & up 01/08/2021   Pneumococcal Conjugate-13 06/28/2017   Pneumococcal Polysaccharide-23 09/12/2018   Tdap 12/24/2017   Zoster Recombinat (Shingrix) 01/10/2020, 04/26/2020    Past Medical History:  Diagnosis Date   Allergic rhinitis    Heart palpitations    Hodgkin's lymphoma (HCC) 03/23/1981   Treated with radiation therapy   Hyperlipidemia    Hypertension    Hypothyroidism    Secondary to radiation therapy    Tobacco History: Social History   Tobacco Use  Smoking Status Some Days   Types: Cigars  Smokeless Tobacco Never  Tobacco Comments   Smokes cigars sometimes. 08/05/22 Tay   Ready to quit: Not Answered Counseling given: Not Answered Tobacco comments: Smokes cigars sometimes. 08/05/22 Tay   Continue to not smoke  Outpatient Encounter Medications as of 08/05/2022  Medication Sig   albuterol (VENTOLIN HFA) 108 (90 Base) MCG/ACT inhaler INHALE 2 PUFFS INTO THE LUNGS EVERY 4 HOURS AS NEEDED FOR WHEEZING OR SHORTNESS OF BREATH.   amLODipine (NORVASC) 10 MG tablet Take 1 tablet (10 mg total) by mouth daily.   levothyroxine (SYNTHROID) 88 MCG tablet TAKE 1 TABLET BY MOUTH EVERY DAY   losartan (COZAAR) 25 MG tablet Take 1 tablet (25 mg total) by mouth daily.   metoprolol succinate (TOPROL-XL) 50 MG 24 hr tablet TAKE 1 TABLET BY MOUTH DAILY WITH OR IMMEDIATELY FOLLOWING A MEAL.   Multiple Vitamin (MULTI-VITAMINS) TABS Take 1 tablet by mouth daily.   rosuvastatin (CRESTOR) 40 MG tablet Take 1 tablet (40 mg total) by mouth daily.   Testosterone 30 MG/ACT SOLN APPLY 1 PUMP TO EACH ARM DAILY   [DISCONTINUED] Albuterol-Budesonide (AIRSUPRA) 90-80 MCG/ACT AERO Inhale 2 puffs into the lungs every 6 (six) hours as needed.   Albuterol-Budesonide (AIRSUPRA) 90-80 MCG/ACT AERO Inhale 2 puffs into the lungs every 6 (six) hours as needed.   No facility-administered encounter medications on file as of 08/05/2022.     Review of Systems  Review of Systems  No chest pain with  exertion, no orthopnea or PND, comprehensive review of systems otherwise negative Physical Exam  BP 118/60 (BP Location: Left Arm)   Pulse 84   Ht 6\' 3"  (1.905 m)   Wt 221 lb 12.8 oz (100.6 kg)   SpO2 98%   BMI 27.72 kg/m   Wt Readings from Last 5 Encounters:  08/05/22 221 lb 12.8 oz (100.6 kg)  07/14/22 226 lb (102.5 kg)  05/27/22 227 lb 9.6 oz (103.2 kg)  05/25/22 225 lb (102.1 kg)  03/10/22 225 lb (102.1 kg)    BMI Readings from Last 5 Encounters:  08/05/22 27.72  kg/m  07/14/22 28.25 kg/m  05/27/22 28.45 kg/m  05/25/22 28.12 kg/m  03/10/22 28.12 kg/m     Physical Exam General: Sitting in chair, no acute distress Eyes: EOMI no icterus Neck: Supple, no JVP Pulmonary: Clear, normal work of breathing Abdomen: Nondistended bowel sounds present MSK: No synovitis, no joint effusion Neuro: Normal gait, no weakness Psych: Normal mood, full affect  Assessment & Plan:   Asthma: Diagnosis child.  Relatively well-controlled until COVID infection and pneumonia 11/2021.  Thickened bronchioles noted on recent CT scan.  Gradually has improved symptoms over time with residual nocturnal symptoms, cough wheeze etc.  Marked improvement with addition of air supra compared to albuterol.  Continue air super for now, co-pay card provided.  Consider alternative inhalers if cost is a barrier in the future.  Prior pneumonia: Resolution on CT scan 05/2022 compared to 11/2021.  No further workup.  Viral related to the time of her postviral community-acquired bacterial pneumonia.  Nocturnal cough, wheeze: Suspect related to asthma as above.   Return in about 3 months (around 11/05/2022) for f/u Dr. Judeth Horn.   Karren Burly, MD 08/05/2022

## 2022-08-24 ENCOUNTER — Encounter: Payer: No Typology Code available for payment source | Admitting: Family Medicine

## 2022-08-24 ENCOUNTER — Other Ambulatory Visit: Payer: Self-pay | Admitting: Family Medicine

## 2022-08-24 DIAGNOSIS — E291 Testicular hypofunction: Secondary | ICD-10-CM

## 2022-08-24 NOTE — Telephone Encounter (Signed)
Is this okay to refill, has appt next week.

## 2022-09-02 ENCOUNTER — Ambulatory Visit (INDEPENDENT_AMBULATORY_CARE_PROVIDER_SITE_OTHER): Payer: No Typology Code available for payment source | Admitting: Family Medicine

## 2022-09-02 ENCOUNTER — Encounter: Payer: Self-pay | Admitting: Family Medicine

## 2022-09-02 VITALS — BP 120/66 | HR 78 | Temp 97.8°F | Resp 14 | Ht 75.0 in | Wt 224.0 lb

## 2022-09-02 DIAGNOSIS — Z8571 Personal history of Hodgkin lymphoma: Secondary | ICD-10-CM

## 2022-09-02 DIAGNOSIS — I10 Essential (primary) hypertension: Secondary | ICD-10-CM

## 2022-09-02 DIAGNOSIS — D126 Benign neoplasm of colon, unspecified: Secondary | ICD-10-CM

## 2022-09-02 DIAGNOSIS — J301 Allergic rhinitis due to pollen: Secondary | ICD-10-CM

## 2022-09-02 DIAGNOSIS — J45909 Unspecified asthma, uncomplicated: Secondary | ICD-10-CM

## 2022-09-02 DIAGNOSIS — Z0000 Encounter for general adult medical examination without abnormal findings: Secondary | ICD-10-CM | POA: Diagnosis not present

## 2022-09-02 DIAGNOSIS — I359 Nonrheumatic aortic valve disorder, unspecified: Secondary | ICD-10-CM

## 2022-09-02 DIAGNOSIS — I453 Trifascicular block: Secondary | ICD-10-CM

## 2022-09-02 DIAGNOSIS — Z125 Encounter for screening for malignant neoplasm of prostate: Secondary | ICD-10-CM

## 2022-09-02 DIAGNOSIS — I350 Nonrheumatic aortic (valve) stenosis: Secondary | ICD-10-CM

## 2022-09-02 DIAGNOSIS — E782 Mixed hyperlipidemia: Secondary | ICD-10-CM

## 2022-09-02 DIAGNOSIS — I341 Nonrheumatic mitral (valve) prolapse: Secondary | ICD-10-CM

## 2022-09-02 DIAGNOSIS — G4733 Obstructive sleep apnea (adult) (pediatric): Secondary | ICD-10-CM

## 2022-09-02 DIAGNOSIS — E291 Testicular hypofunction: Secondary | ICD-10-CM

## 2022-09-02 DIAGNOSIS — E039 Hypothyroidism, unspecified: Secondary | ICD-10-CM

## 2022-09-02 MED ORDER — METOPROLOL SUCCINATE ER 50 MG PO TB24: ORAL_TABLET | ORAL | 3 refills | Status: DC

## 2022-09-02 MED ORDER — ROSUVASTATIN CALCIUM 40 MG PO TABS: 40.0000 mg | ORAL_TABLET | Freq: Every day | ORAL | 3 refills | Status: DC

## 2022-09-02 MED ORDER — LOSARTAN POTASSIUM 25 MG PO TABS: 25.0000 mg | ORAL_TABLET | Freq: Every day | ORAL | 3 refills | Status: DC

## 2022-09-02 MED ORDER — LEVOTHYROXINE SODIUM 88 MCG PO TABS: 88.0000 ug | ORAL_TABLET | Freq: Every day | ORAL | 1 refills | Status: DC

## 2022-09-02 NOTE — Progress Notes (Signed)
Complete physical exam  Patient: John Clay   DOB: 07-06-1960   62 y.o. Male  MRN: [id number]  Subjective:    Chief Complaint  Patient presents with   Annual Exam    Fasting. Has concerns about increased fatigue and SOB.     John Clay is a 62 y.o. male who presents today for a complete physical exam. He reports consuming a general diet. Gym/ health club routine includes cardio and weight training. He generally feels fairly well. He reports sleeping fairly well. He continues have difficulty with fatigue and some shortness of breath.  He has been evaluated by pulmonary as well as cardiology.  He has a previous history of Hodgkin's and radiation.  He has also had history of COVID.  He continues on [place] which she seems to think does help with his shortness of breath symptoms.  He is using albuterol roughly twice per week.  Continues on metoprolol.  He is also taking Synthroid.  He is now taking 4 pumps of testosterone daily.  He does have OSA and is doing quite nicely on CPAP and gets good readouts on that.  Does have adenomatous colonic polyp and is scheduled for repeat on 2027.  Most recent fall risk assessment:     No data to display           Most recent depression screenings:    09/02/2022   10:18 AM 08/11/2021    1:45 PM  PHQ 2/9 Scores  PHQ - 2 Score 1 3  PHQ- 9 Score  3    Vision:Not within last year  and Dental: Receives regular dental care    Patient Care Team: Ronnald Nian, MD as PCP - General (Family Medicine)   Outpatient Medications Prior to Visit  Medication Sig   albuterol (VENTOLIN HFA) 108 (90 Base) MCG/ACT inhaler INHALE 2 PUFFS INTO THE LUNGS EVERY 4 HOURS AS NEEDED FOR WHEEZING OR SHORTNESS OF BREATH.   Albuterol-Budesonide (AIRSUPRA) 90-80 MCG/ACT AERO Inhale 2 puffs into the lungs every 6 (six) hours as needed.   amLODipine (NORVASC) 10 MG tablet Take 1 tablet (10 mg total) by mouth daily.   Multiple Vitamin (MULTI-VITAMINS) TABS  Take 1 tablet by mouth daily.   Testosterone 30 MG/ACT SOLN APPLY 1 PUMP TO EACH ARM DAILY   [DISCONTINUED] levothyroxine (SYNTHROID) 88 MCG tablet TAKE 1 TABLET BY MOUTH EVERY DAY   [DISCONTINUED] losartan (COZAAR) 25 MG tablet Take 1 tablet (25 mg total) by mouth daily.   [DISCONTINUED] metoprolol succinate (TOPROL-XL) 50 MG 24 hr tablet TAKE 1 TABLET BY MOUTH DAILY WITH OR IMMEDIATELY FOLLOWING A MEAL.   [DISCONTINUED] rosuvastatin (CRESTOR) 40 MG tablet Take 1 tablet (40 mg total) by mouth daily.   No facility-administered medications prior to visit.    Review of Systems  All other systems reviewed and are negative.         Objective:     BP 120/66   Pulse 78   Temp 97.8 F (36.6 C) (Oral)   Resp 14   Ht 6\' 3"  (1.905 m)   Wt 224 lb (101.6 kg)   SpO2 99% Comment: room air  BMI 28.00 kg/m    Physical Exam  Alert and in no distress. Tympanic membranes and canals are normal. Pharyngeal area is normal. Neck is supple without adenopathy or thyromegaly. Cardiac exam shows a regular sinus rhythm with 2/6 SEM no gallops. Lungs are clear to auscultation.      Assessment & Plan:  Routine general medical examination at a health care facility  Aortic valve calcification  Essential hypertension - Plan: losartan (COZAAR) 25 MG tablet  MVP (mitral valve prolapse)  Nonrheumatic aortic valve stenosis  Trifascicular block  OSA (obstructive sleep apnea)  Seasonal allergic rhinitis due to pollen  Tubular adenoma of colon  Acquired hypothyroidism - Plan: levothyroxine (SYNTHROID) 88 MCG tablet  Hypogonadism in male - Plan: Testosterone  History of Hodgkin's lymphoma  Mixed hyperlipidemia - Plan: rosuvastatin (CRESTOR) 40 MG tablet, Lipid panel  Mild asthma, unspecified whether complicated, unspecified whether persistent  Essential (primary) hypertension - Plan: metoprolol succinate (TOPROL-XL) 50 MG 24 hr tablet  Screening for prostate cancer - Plan:  PSA  Immunization History  Administered Date(s) Administered   Influenza Split 01/06/2012, 12/20/2012   Influenza, Quadrivalent, Recombinant, Inj, Pf 12/07/2017   Influenza,inj,Quad PF,6+ Mos 12/12/2019, 01/08/2021, 12/03/2021   Influenza-Unspecified 11/30/2016, 12/23/2016, 12/07/2017, 12/05/2018   PFIZER Comirnaty(Gray Top)Covid-19 Tri-Sucrose Vaccine 07/03/2020   PFIZER(Purple Top)SARS-COV-2 Vaccination 05/22/2019, 06/14/2019, 12/12/2019   Pfizer Covid-19 Vaccine Bivalent Booster 60yrs & up 01/08/2021   Pneumococcal Conjugate-13 06/28/2017   Pneumococcal Polysaccharide-23 09/12/2018   Tdap 12/24/2017   Zoster Recombinat (Shingrix) 01/10/2020, 04/26/2020    Health Maintenance  Topic Date Due   HIV Screening  Never done   COVID-19 Vaccine (6 - 2023-24 season) 11/21/2021   INFLUENZA VACCINE  10/22/2022   Colonoscopy  05/14/2027   DTaP/Tdap/Td (2 - Td or Tdap) 12/25/2027   Hepatitis C Screening  Completed   Zoster Vaccines- Shingrix  Completed   HPV VACCINES  Aged Out    Discussed health benefits of physical activity, and encouraged him to engage in regular exercise appropriate for his age and condition.  Explained that he does have multiple reasons for being short of breath with fatigue as mentioned earlier.  He is also discussed this with pulmonary and cardiology.  We will continue to monitor this.  Will continue on his CPAP.  He he will continue on his other medications including the inhalers.  He will keep me informed as to how he is doing and we can readdress this if he has further difficulties.  He was comfortable with this.  We will wait to see what his testosterone level shows.  Problem List Items Addressed This Visit     Acquired hypothyroidism   Relevant Medications   levothyroxine (SYNTHROID) 88 MCG tablet   metoprolol succinate (TOPROL-XL) 50 MG 24 hr tablet   Aortic valve calcification   Relevant Medications   losartan (COZAAR) 25 MG tablet   metoprolol succinate  (TOPROL-XL) 50 MG 24 hr tablet   rosuvastatin (CRESTOR) 40 MG tablet   Essential hypertension   Relevant Medications   losartan (COZAAR) 25 MG tablet   metoprolol succinate (TOPROL-XL) 50 MG 24 hr tablet   rosuvastatin (CRESTOR) 40 MG tablet   History of Hodgkin's lymphoma   Hyperlipidemia   Relevant Medications   losartan (COZAAR) 25 MG tablet   metoprolol succinate (TOPROL-XL) 50 MG 24 hr tablet   rosuvastatin (CRESTOR) 40 MG tablet   Other Relevant Orders   Lipid panel   Hypogonadism in male   Relevant Orders   Testosterone   MVP (mitral valve prolapse)   Relevant Medications   losartan (COZAAR) 25 MG tablet   metoprolol succinate (TOPROL-XL) 50 MG 24 hr tablet   rosuvastatin (CRESTOR) 40 MG tablet   Nonrheumatic aortic valve stenosis   Relevant Medications   losartan (COZAAR) 25 MG tablet   metoprolol succinate (TOPROL-XL) 50 MG  24 hr tablet   rosuvastatin (CRESTOR) 40 MG tablet   OSA (obstructive sleep apnea)   Seasonal allergic rhinitis due to pollen   Trifascicular block   Relevant Medications   losartan (COZAAR) 25 MG tablet   metoprolol succinate (TOPROL-XL) 50 MG 24 hr tablet   rosuvastatin (CRESTOR) 40 MG tablet   Tubular adenoma of colon   Other Visit Diagnoses     Routine general medical examination at a health care facility    -  Primary   Mild asthma, unspecified whether complicated, unspecified whether persistent       Essential (primary) hypertension       Relevant Medications   losartan (COZAAR) 25 MG tablet   metoprolol succinate (TOPROL-XL) 50 MG 24 hr tablet   rosuvastatin (CRESTOR) 40 MG tablet   Screening for prostate cancer       Relevant Orders   PSA      Follow-up 1 year    Sharlot Gowda, MD

## 2022-09-03 LAB — PSA: Prostate Specific Ag, Serum: 0.6 ng/mL (ref 0.0–4.0)

## 2022-09-03 LAB — LIPID PANEL
Chol/HDL Ratio: 1.9 ratio (ref 0.0–5.0)
Cholesterol, Total: 107 mg/dL (ref 100–199)
HDL: 56 mg/dL (ref >39)
LDL Chol Calc (NIH): 39 mg/dL (ref 0–99)
Triglycerides: 50 mg/dL (ref 0–149)
VLDL Cholesterol Cal: 12 mg/dL (ref 5–40)

## 2022-09-03 LAB — TESTOSTERONE: Testosterone: 191 ng/dL — ABNORMAL LOW (ref 264–916)

## 2022-09-04 ENCOUNTER — Encounter: Payer: Self-pay | Admitting: Family Medicine

## 2022-09-08 ENCOUNTER — Other Ambulatory Visit: Payer: Self-pay

## 2022-09-08 DIAGNOSIS — E291 Testicular hypofunction: Secondary | ICD-10-CM

## 2022-09-15 ENCOUNTER — Telehealth: Payer: Self-pay | Admitting: Family Medicine

## 2022-09-15 DIAGNOSIS — E291 Testicular hypofunction: Secondary | ICD-10-CM

## 2022-09-15 MED ORDER — TESTOSTERONE 30 MG/ACT TD SOLN: 6.0000 | Freq: Every day | TRANSDERMAL | 1 refills | Status: DC

## 2022-09-15 NOTE — Telephone Encounter (Signed)
Pt left voice mail that he is out of testosterone and needs refill with new increased directions Pt states he is at 6 pumps per day

## 2022-09-30 ENCOUNTER — Other Ambulatory Visit: Payer: Self-pay | Admitting: Family Medicine

## 2022-09-30 DIAGNOSIS — I10 Essential (primary) hypertension: Secondary | ICD-10-CM

## 2022-10-07 ENCOUNTER — Other Ambulatory Visit: Payer: No Typology Code available for payment source

## 2022-10-09 ENCOUNTER — Other Ambulatory Visit: Payer: No Typology Code available for payment source

## 2022-10-09 DIAGNOSIS — E291 Testicular hypofunction: Secondary | ICD-10-CM

## 2022-10-10 LAB — TESTOSTERONE: Testosterone: 461 ng/dL (ref 264–916)

## 2022-10-12 ENCOUNTER — Telehealth: Payer: Self-pay | Admitting: Family Medicine

## 2022-10-12 DIAGNOSIS — E291 Testicular hypofunction: Secondary | ICD-10-CM

## 2022-10-12 MED ORDER — TESTOSTERONE 30 MG/ACT TD SOLN: 6.0000 | Freq: Every day | TRANSDERMAL | 5 refills | Status: DC

## 2022-10-12 NOTE — Telephone Encounter (Signed)
Pt called and states you increased his testosterone at last visit and now he has ran out before his refill date so he is wanting to know if you can send in a new prescription.  [id number] - [place],  - [address]

## 2022-10-16 ENCOUNTER — Telehealth: Payer: No Typology Code available for payment source | Admitting: Family Medicine

## 2022-10-16 ENCOUNTER — Telehealth: Payer: No Typology Code available for payment source

## 2022-10-16 DIAGNOSIS — U071 COVID-19: Secondary | ICD-10-CM

## 2022-10-16 MED ORDER — BENZONATATE 200 MG PO CAPS: 200.0000 mg | ORAL_CAPSULE | Freq: Two times a day (BID) | ORAL | 0 refills | Status: DC | PRN

## 2022-10-16 MED ORDER — PREDNISONE 20 MG PO TABS: 20.0000 mg | ORAL_TABLET | Freq: Two times a day (BID) | ORAL | 0 refills | Status: AC

## 2022-10-16 NOTE — Progress Notes (Signed)
E-Visit  for Positive Covid Test Result   We are sorry you are not feeling well. We are here to help!  You have tested positive for COVID-19, meaning that you were infected with the novel coronavirus and could give the virus to others.  Most people with COVID-19 have mild illness and can recover at home without medical care. Do not leave your home, except to get medical care. Do not visit public areas and do not go to places where you are unable to wear a mask. It is important that you stay home  to take care for yourself and to help protect other people in your home and community.      Isolation Instructions:   You are to isolate at home until you have been fever free for at least 24 hours without a fever-reducing medication, and symptoms have been steadily improving for 24 hours. At that time,  you can end isolation but need to mask for an additional 5 days.  If you must be around other household members who do not have symptoms, you need to make sure that both you and the family members are masking consistently with a high-quality mask.  If you note any worsening of symptoms despite treatment, please seek an in-person evaluation ASAP. If you note any significant shortness of breath or any chest pain, please seek ER evaluation. Please do not delay care!   Go to the nearest hospital ED for assessment if fever/cough/breathlessness are severe or illness seems like a threat to life.    The following symptoms may appear 2-14 days after exposure: Fever Cough Shortness of breath or difficulty breathing Chills Repeated shaking with chills Muscle pain Headache Sore throat New loss of taste or smell Fatigue Congestion or runny nose Nausea or vomiting Diarrhea  You can use medication such as prescription cough medication called Tessalon Perles 100 mg. You may take 1-2 capsules every 8 hours as needed for cough I will also send prednisone.   You may also take acetaminophen (Tylenol) as  needed for fever.  HOME CARE: Only take medications as instructed by your medical team. Drink plenty of fluids and get plenty of rest. A steam or ultrasonic humidifier can help if you have congestion.   GET HELP RIGHT AWAY IF YOU HAVE EMERGENCY WARNING SIGNS.  Call 911 or proceed to your closest emergency facility if: You develop worsening high fever. Trouble breathing Bluish lips or face Persistent pain or pressure in the chest New confusion Inability to wake or stay awake You cough up blood. Your symptoms become more severe Inability to hold down food or fluids  This list is not all possible symptoms. Contact your medical provider for any symptoms that are severe or concerning to you.   Your e-visit answers were reviewed by a board certified advanced clinical practitioner to complete your personal care plan.  Depending on the condition, your plan could have included both over the counter or prescription medications.  If there is a problem please reply once you have received a response from your provider.  Your safety is important to [place].  If you have drug allergies check your prescription carefully.    You can use MyChart to ask questions about today's visit, request a non-urgent call back, or ask for a work or school excuse for 24 hours related to this e-Visit. If it has been greater than 24 hours you will need to follow up with your provider, or enter a new e-Visit to address those  concerns. You will get an e-mail in the next two days asking about your experience.  I hope that your e-visit has been valuable and will speed your recovery. Thank you for using e-visits.   have provided 5 minutes of non face to face time during this encounter for chart review and documentation.

## 2022-11-12 ENCOUNTER — Other Ambulatory Visit: Payer: Self-pay | Admitting: Family Medicine

## 2022-11-12 DIAGNOSIS — E291 Testicular hypofunction: Secondary | ICD-10-CM

## 2022-11-20 ENCOUNTER — Encounter: Payer: Self-pay | Admitting: Family Medicine

## 2022-12-02 ENCOUNTER — Other Ambulatory Visit (INDEPENDENT_AMBULATORY_CARE_PROVIDER_SITE_OTHER): Payer: No Typology Code available for payment source

## 2022-12-02 DIAGNOSIS — Z23 Encounter for immunization: Secondary | ICD-10-CM | POA: Diagnosis not present

## 2022-12-03 ENCOUNTER — Ambulatory Visit (INDEPENDENT_AMBULATORY_CARE_PROVIDER_SITE_OTHER): Payer: No Typology Code available for payment source | Admitting: Family Medicine

## 2022-12-03 ENCOUNTER — Encounter: Payer: Self-pay | Admitting: Family Medicine

## 2022-12-03 VITALS — BP 114/68 | HR 86 | Wt 225.4 lb

## 2022-12-03 DIAGNOSIS — F418 Other specified anxiety disorders: Secondary | ICD-10-CM | POA: Diagnosis not present

## 2022-12-03 MED ORDER — ALPRAZOLAM 0.25 MG PO TABS: 0.2500 mg | ORAL_TABLET | Freq: Two times a day (BID) | ORAL | 0 refills | Status: AC | PRN

## 2022-12-03 NOTE — Progress Notes (Signed)
   Subjective:    Patient ID: John Clay, male    DOB: [birth year], 62 y.o.   MRN: [id number]  HPI He is here for consult concerning possibly starting on an antidepressant.  He is presently involved in a lawyer assistance program getting counseling since March.  He did this on his own.  He recognizes that in the past he has gotten in trouble by not learning how to say no and sometimes getting too many irons in the fire.  He recognizes that this has caused some difficulty in the past.  His therapist thinks that he might benefit from a medication however he is reluctant to be put on anything due to possible side effects.  He then describes anxiety with sweating that is more episodic in nature and is interested in help with that.   Review of Systems     Objective:    Physical Exam Alert and in no distress with appropriate affect       Assessment & Plan:   Problem List Items Addressed This Visit   None Visit Diagnoses     Situational anxiety    -  Primary   Relevant Medications   ALPRAZolam (XANAX) 0.25 MG tablet     I discussed the situation with him and detail.  Discussed learning how to say no and not feeling bad about it.  Also discussed various medication use and at this point he would like to try episodic care.  I will give him Xanax that he is to try before he has an anxiety provoking incident to see how it works and keep me informed.  No follow-up appointment was made but he knows to contact me and continue in counseling.  Over 25 minutes spent discussing this issue with him.

## 2022-12-06 ENCOUNTER — Telehealth: Payer: Self-pay | Admitting: Family Medicine

## 2022-12-06 NOTE — Telephone Encounter (Signed)
P.A. TESTOSTERONE 30MG /ACT renewal

## 2022-12-12 ENCOUNTER — Other Ambulatory Visit: Payer: Self-pay | Admitting: Family Medicine

## 2022-12-12 DIAGNOSIS — E291 Testicular hypofunction: Secondary | ICD-10-CM

## 2022-12-21 NOTE — Telephone Encounter (Signed)
P.A. approved til 12/06/23, called pharmacy went thru, but on back order until tomorrow.  Called pt and informed

## 2023-02-01 ENCOUNTER — Ambulatory Visit (HOSPITAL_COMMUNITY): Payer: No Typology Code available for payment source | Attending: Cardiovascular Disease

## 2023-02-01 DIAGNOSIS — I359 Nonrheumatic aortic valve disorder, unspecified: Secondary | ICD-10-CM | POA: Diagnosis present

## 2023-02-01 LAB — ECHOCARDIOGRAM COMPLETE
AR max vel: 1.15 cm2
AV Area VTI: 1.22 cm2
AV Area mean vel: 1.16 cm2
AV Mean grad: 18 mm[Hg]
AV Peak grad: 30.5 mm[Hg]
Ao pk vel: 2.76 m/s
Area-P 1/2: 5.38 cm2
S' Lateral: 2.8 cm

## 2023-02-15 ENCOUNTER — Telehealth: Payer: Self-pay

## 2023-02-15 DIAGNOSIS — I5189 Other ill-defined heart diseases: Secondary | ICD-10-CM

## 2023-02-15 DIAGNOSIS — I359 Nonrheumatic aortic valve disorder, unspecified: Secondary | ICD-10-CM

## 2023-02-15 DIAGNOSIS — I2510 Atherosclerotic heart disease of native coronary artery without angina pectoris: Secondary | ICD-10-CM

## 2023-02-15 NOTE — Telephone Encounter (Signed)
-----   Message from Christell Constant sent at 02/14/2023  1:42 PM EST ----- Thanks. Fillmore Shellhammer- can we order the CMR? It won't be completed by our visit but my hope is for it to not be delayed.  Thanks, MAC ----- Message ----- From: Rollene Rotunda, MD Sent: 02/11/2023   8:07 PM EST To: Freddi Starr, RN; Christell Constant, MD  The patient was referred to Dr. Izora Ribas and should have an appt with him in December.  I have reviewed the echo with Dr. Izora Ribas and he will be planning follow up with an MRI.  The patient has some valvular disease that we are likely to continue to follow clinically.   I am not sure if this has been ordered yet or if this will be after the appt.  Please make sure the patient has an appt with Dr. Izora Ribas.  Call Mr. Tkaczyk with the results and send results to Ronnald Nian, MD

## 2023-02-16 NOTE — Telephone Encounter (Signed)
Placed an order for Cmri.

## 2023-03-03 NOTE — Telephone Encounter (Signed)
Patient does not have appointment with Dr. Izora Ribas scheduled yet. Does this need to be on a combined clinic day? If so, next available is 02/03. Dr. Izora Ribas has a couple of consult slots on 04/16/23 but nothing available in December. Does he need to be worked in for December?

## 2023-03-06 IMAGING — CT CT CARDIAC CORONARY ARTERY CALCIUM SCORE
3 series · 13 of 20 positions shown, 15 images · non-contrast
Comparison: None.

CLINICAL DATA: Hypertension

EXAM:
CT CARDIAC CORONARY ARTERY CALCIUM SCORE
TECHNIQUE: Non-contrast imaging through the heart was performed using
prospective ECG gating. Image post processing was performed on an
independent workstation, allowing for quantitative analysis of the
heart and coronary arteries. Note that this exam targets the heart
and the chest was not imaged in its entirety.

[Series 2: calcium scoring 2.00 qr36 bestdiast 71% hrt calciu · axial · 0.40mm/px · z∈[+1775,+1831]mm · 3 of 70 slices shown]
[im 14/70  vessel]
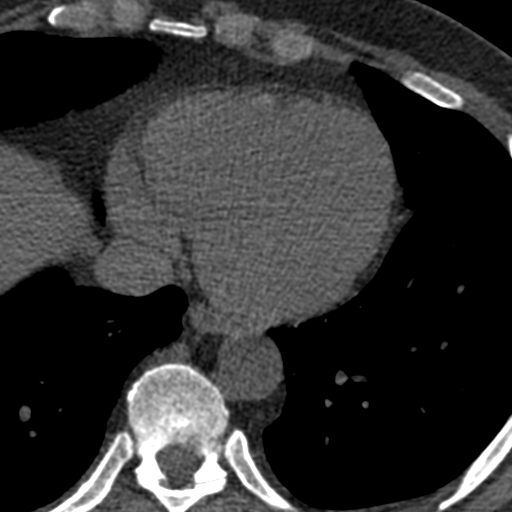
[im 28/70  vessel]
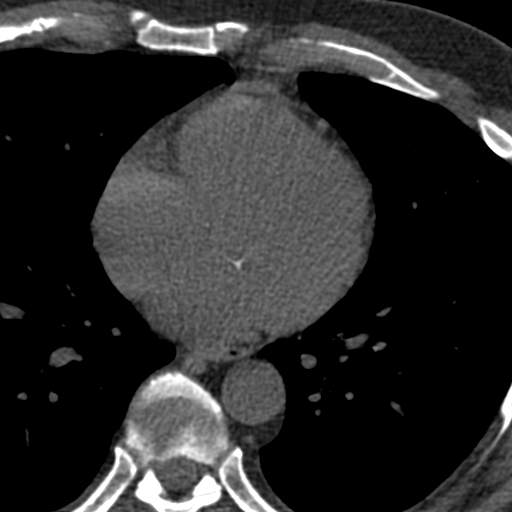
[im 42/70  vessel]
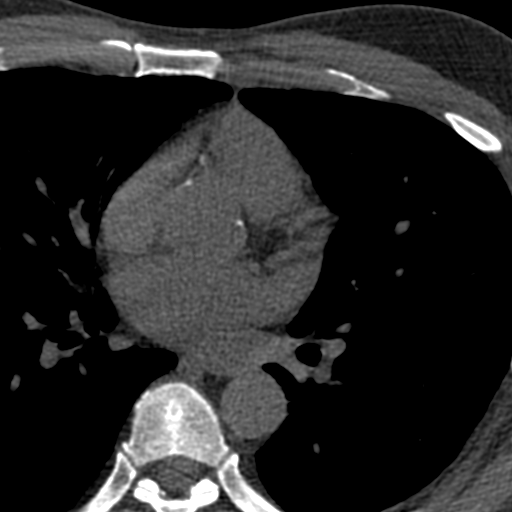

[Series 3: calcium scoring 2.00 br40 bestdiast 71% axial · axial · 0.51mm/px · z∈[+1771,+1863]mm · 5 of 70 slices shown, 7 images]
[im 12/70  vessel]
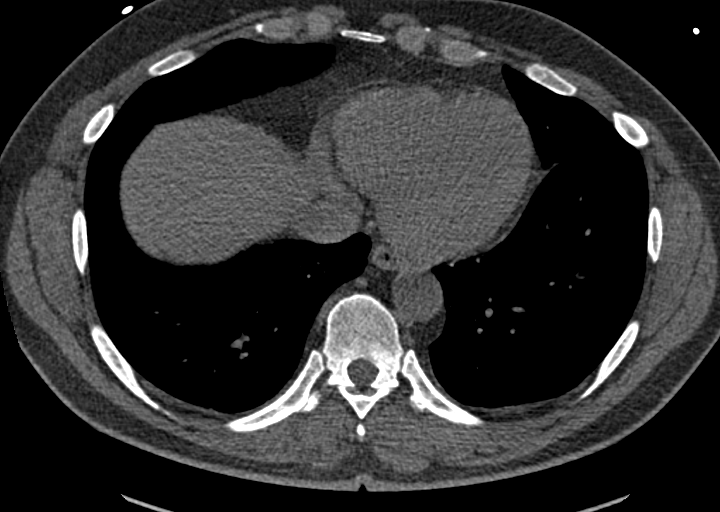
[im 12/70  lung]
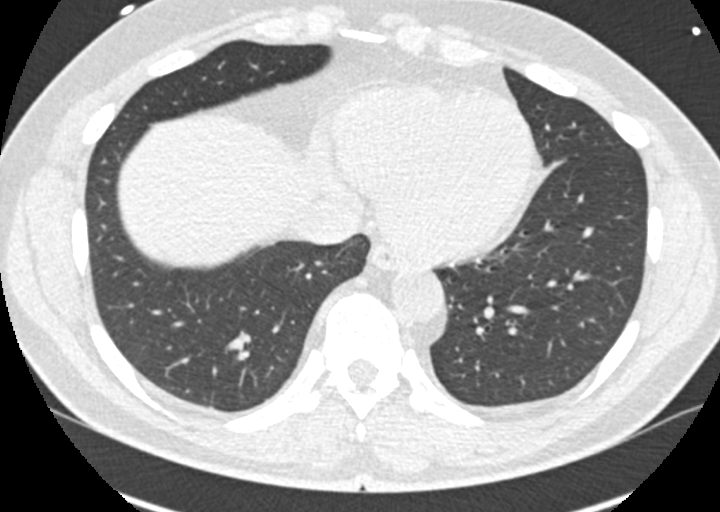
[im 24/70  vessel]
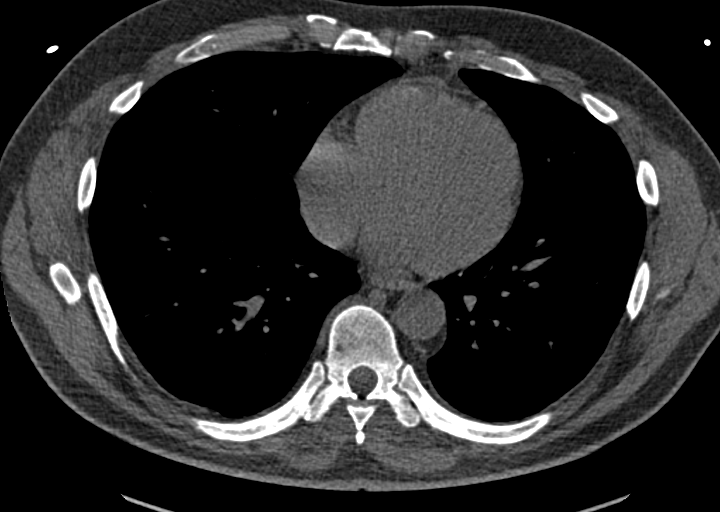
[im 35/70  vessel]
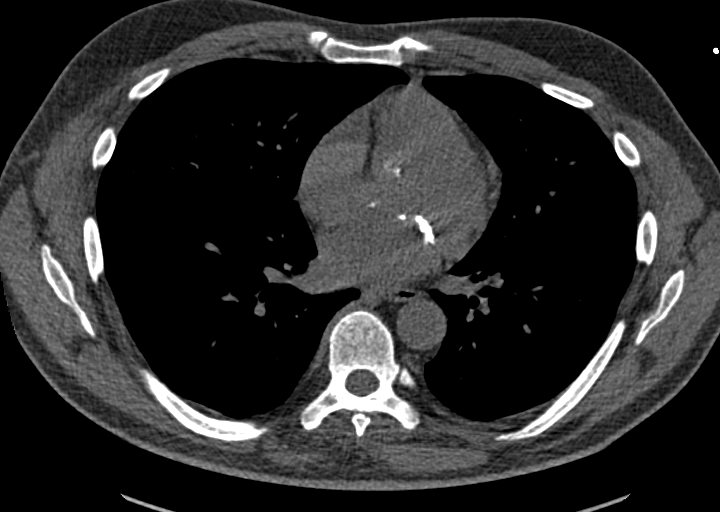
[im 47/70  vessel]
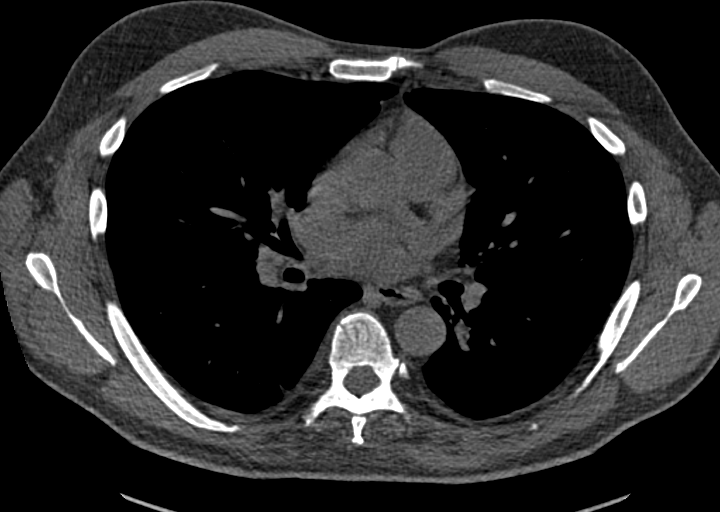
[im 58/70  vessel]
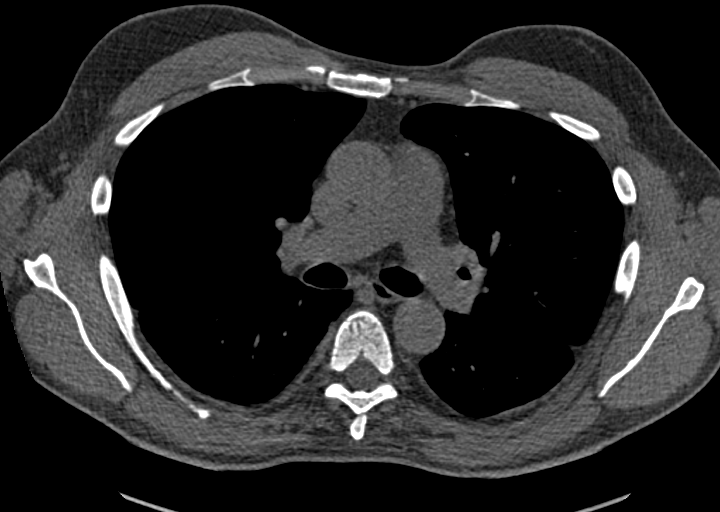
[im 58/70  lung]
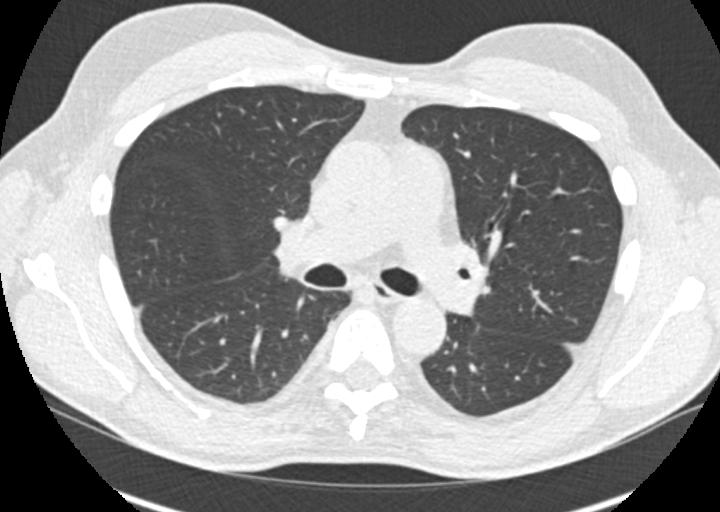

[Series 9: calcium scoring 2.00 br60 bestdiast 71% lungs · axial · 0.52mm/px · z∈[+1771,+1863]mm · 5 of 70 slices shown]
[im 12/70  vessel]
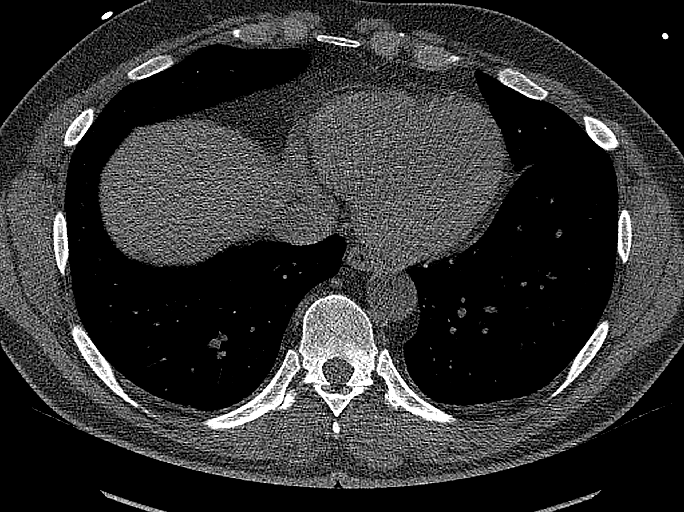
[im 24/70  vessel]
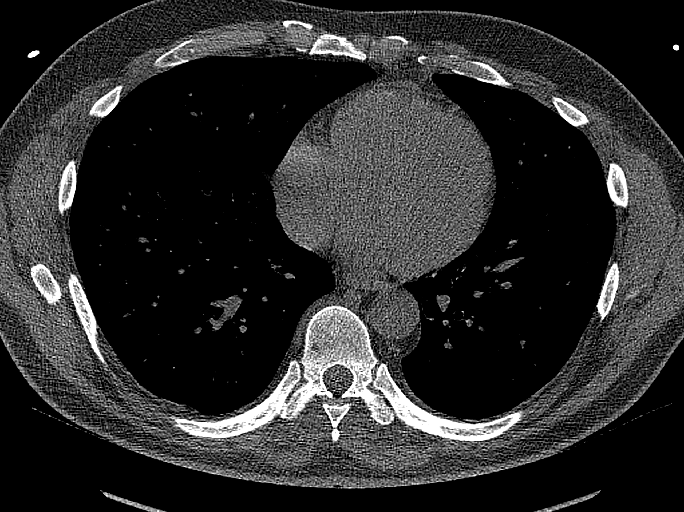
[im 35/70  vessel]
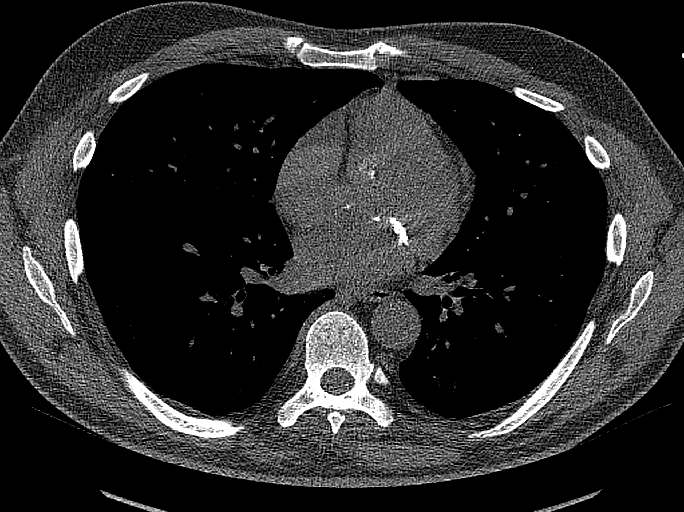
[im 47/70  vessel]
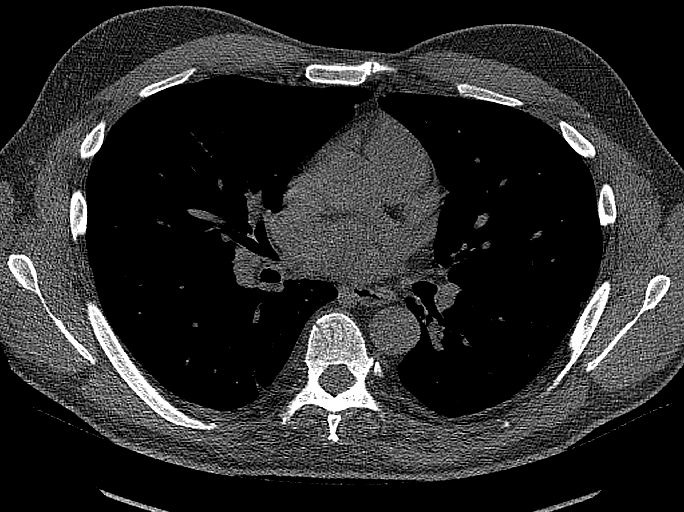
[im 58/70  vessel]
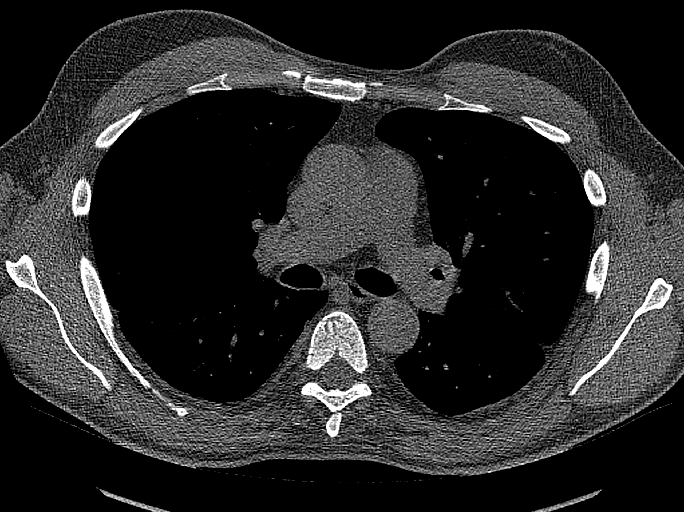

[13 of 20 positions shown; findings below may reference images not displayed]

FINDINGS: CORONARY CALCIUM SCORES:

Left Main: 0

LAD: 0

LCx: 0

RCA: 47

Total Agatston Score: 47

[HOSPITAL] percentile: 74

AORTA MEASUREMENTS:

Ascending Aorta: 29 mm

Descending Aorta: 26 mm

OTHER FINDINGS:

Heart is normal size. Mitral and aortic valve calcifications. Aortic
calcifications. No adenopathy. No confluent opacities or effusions
in the lungs. Imaging into the upper abdomen demonstrates no acute
findings. Chest wall soft tissues are unremarkable. No acute bony
abnormality.
IMPRESSION: Total Agatston score: 47

[HOSPITAL] percentile: 74

Aortic atherosclerosis.

No acute extra cardiac abnormality.

## 2023-04-21 ENCOUNTER — Encounter (HOSPITAL_COMMUNITY): Payer: Self-pay

## 2023-04-23 NOTE — Telephone Encounter (Signed)
Called pt reports is aware that Cardiac MRI instructions are on my chart to review.  I reviewed instructions with pt briefly.  Pt reports will review over the weekend for testing 04/26/23 at 8 am. No questions or concerns.

## 2023-04-26 ENCOUNTER — Other Ambulatory Visit: Payer: Self-pay | Admitting: Internal Medicine

## 2023-04-26 ENCOUNTER — Encounter: Payer: Self-pay | Admitting: Internal Medicine

## 2023-04-26 ENCOUNTER — Ambulatory Visit (HOSPITAL_COMMUNITY)
Admission: RE | Admit: 2023-04-26 | Discharge: 2023-04-26 | Disposition: A | Discharge status: Home / Self Care | Payer: No Typology Code available for payment source | Source: Ambulatory Visit | Attending: Internal Medicine | Admitting: Internal Medicine

## 2023-04-26 DIAGNOSIS — I359 Nonrheumatic aortic valve disorder, unspecified: Secondary | ICD-10-CM | POA: Diagnosis present

## 2023-04-26 DIAGNOSIS — I5189 Other ill-defined heart diseases: Secondary | ICD-10-CM

## 2023-04-26 DIAGNOSIS — I2510 Atherosclerotic heart disease of native coronary artery without angina pectoris: Secondary | ICD-10-CM

## 2023-04-26 DIAGNOSIS — I2584 Coronary atherosclerosis due to calcified coronary lesion: Secondary | ICD-10-CM

## 2023-04-26 MED ORDER — GADOBUTROL 1 MMOL/ML IV SOLN
10.0000 mL | Freq: Once | INTRAVENOUS | Status: AC | PRN
  Administered 2023-04-26: 10 mL via INTRAVENOUS

## 2023-05-18 ENCOUNTER — Encounter: Payer: Self-pay | Admitting: Internal Medicine

## 2023-05-24 ENCOUNTER — Institutional Professional Consult (permissible substitution): Payer: No Typology Code available for payment source | Admitting: Internal Medicine

## 2023-05-24 ENCOUNTER — Encounter: Payer: No Typology Code available for payment source | Admitting: Genetic Counselor

## 2023-05-31 ENCOUNTER — Telehealth: Admitting: Physician Assistant

## 2023-05-31 DIAGNOSIS — J329 Chronic sinusitis, unspecified: Secondary | ICD-10-CM | POA: Diagnosis not present

## 2023-05-31 DIAGNOSIS — J40 Bronchitis, not specified as acute or chronic: Secondary | ICD-10-CM | POA: Diagnosis not present

## 2023-05-31 MED ORDER — PROMETHAZINE-DM 6.25-15 MG/5ML PO SYRP: 5.0000 mL | ORAL_SOLUTION | Freq: Four times a day (QID) | ORAL | 0 refills | Status: DC | PRN

## 2023-05-31 MED ORDER — AIRSUPRA 90-80 MCG/ACT IN AERO: 2.0000 | INHALATION_SPRAY | Freq: Four times a day (QID) | RESPIRATORY_TRACT | 0 refills | Status: DC | PRN

## 2023-05-31 MED ORDER — AMOXICILLIN-POT CLAVULANATE 875-125 MG PO TABS: 1.0000 | ORAL_TABLET | Freq: Two times a day (BID) | ORAL | 0 refills | Status: DC

## 2023-05-31 MED ORDER — ALBUTEROL SULFATE HFA 108 (90 BASE) MCG/ACT IN AERS: 2.0000 | INHALATION_SPRAY | RESPIRATORY_TRACT | 0 refills | Status: DC | PRN

## 2023-05-31 MED ORDER — PREDNISONE 20 MG PO TABS: 40.0000 mg | ORAL_TABLET | Freq: Every day | ORAL | 0 refills | Status: DC

## 2023-05-31 NOTE — Patient Instructions (Signed)
 Tonette Bihari, thank you for joining Margaretann Loveless, PA-C for today's virtual visit.  While this provider is not your primary care provider (PCP), if your PCP is located in our provider database this encounter information will be shared with them immediately following your visit.   A [place] MyChart account gives you access to today's visit and all your visits, tests, and labs performed at [facility] " click here if you don't have a [facility] MyChart account or go to [email or web address]  Consent: (Patient) Tonette Bihari provided verbal consent for this virtual visit at the beginning of the encounter.  Current Medications:  Current Outpatient Medications:    amoxicillin-clavulanate (AUGMENTIN) 875-125 MG tablet, Take 1 tablet by mouth 2 (two) times daily., Disp: 20 tablet, Rfl: 0   predniSONE (DELTASONE) 20 MG tablet, Take 2 tablets (40 mg total) by mouth daily with breakfast., Disp: 10 tablet, Rfl: 0   promethazine-dextromethorphan (PROMETHAZINE-DM) 6.25-15 MG/5ML syrup, Take 5 mLs by mouth 4 (four) times daily as needed., Disp: 118 mL, Rfl: 0   albuterol (VENTOLIN HFA) 108 (90 Base) MCG/ACT inhaler, Inhale 2 puffs into the lungs every 4 (four) hours as needed for wheezing or shortness of breath., Disp: 8 each, Rfl: 0   Albuterol-Budesonide (AIRSUPRA) 90-80 MCG/ACT AERO, Inhale 2 puffs into the lungs every 6 (six) hours as needed., Disp: 10.7 g, Rfl: 0   ALPRAZolam (XANAX) 0.25 MG tablet, Take 1 tablet (0.25 mg total) by mouth 2 (two) times daily as needed for anxiety., Disp: 20 tablet, Rfl: 0   amLODipine (NORVASC) 10 MG tablet, TAKE 1 TABLET BY MOUTH EVERY DAY, Disp: 90 tablet, Rfl: 3   levothyroxine (SYNTHROID) 88 MCG tablet, Take 1 tablet (88 mcg total) by mouth daily., Disp: 90 tablet, Rfl: 1   losartan (COZAAR) 25 MG tablet, Take 1 tablet (25 mg total) by mouth daily., Disp: 90 tablet, Rfl: 3   metoprolol succinate (TOPROL-XL) 50 MG 24 hr tablet,  TAKE 1 TABLET BY MOUTH DAILY WITH OR IMMEDIATELY FOLLOWING A MEAL., Disp: 90 tablet, Rfl: 3   Multiple Vitamin (MULTI-VITAMINS) TABS, Take 1 tablet by mouth daily., Disp: , Rfl:    rosuvastatin (CRESTOR) 40 MG tablet, Take 1 tablet (40 mg total) by mouth daily., Disp: 90 tablet, Rfl: 3   Testosterone 30 MG/ACT SOLN, Apply 6 Pump topically daily. One month supply, Disp: 180 mL, Rfl: 5   Medications ordered in this encounter:  Meds ordered this encounter  Medications   Albuterol-Budesonide (AIRSUPRA) 90-80 MCG/ACT AERO    Sig: Inhale 2 puffs into the lungs every 6 (six) hours as needed.    Dispense:  10.7 g    Refill:  0    Supervising Provider:   Merrilee Jansky [[id number]]   albuterol (VENTOLIN HFA) 108 (90 Base) MCG/ACT inhaler    Sig: Inhale 2 puffs into the lungs every 4 (four) hours as needed for wheezing or shortness of breath.    Dispense:  8 each    Refill:  0    Supervising Provider:   Merrilee Jansky [[id number]]   amoxicillin-clavulanate (AUGMENTIN) 875-125 MG tablet    Sig: Take 1 tablet by mouth 2 (two) times daily.    Dispense:  20 tablet    Refill:  0    Supervising Provider:   Merrilee Jansky [[id number]]   predniSONE (DELTASONE) 20 MG tablet    Sig: Take 2 tablets (40 mg total) by mouth daily with breakfast.    Dispense:  10  tablet    Refill:  0    Supervising Provider:   Merrilee Jansky [[id number]]   promethazine-dextromethorphan (PROMETHAZINE-DM) 6.25-15 MG/5ML syrup    Sig: Take 5 mLs by mouth 4 (four) times daily as needed.    Dispense:  118 mL    Refill:  0    Supervising Provider:   Merrilee Jansky [[id number]]     *If you need refills on other medications prior to your next appointment, please contact your pharmacy*  Follow-Up: Call back or seek an in-person evaluation if the symptoms worsen or if the condition fails to improve as anticipated.  [facility] [phone]  Other Instructions  Acute Bronchitis, Adult  Acute bronchitis is  when air tubes in the lungs (bronchi) suddenly get swollen. The condition can make it hard for you to breathe. In adults, acute bronchitis usually goes away within 2 weeks. A cough caused by bronchitis may last up to 3 weeks. Smoking, allergies, and asthma can make the condition worse. What are the causes? Germs that cause cold and flu (viruses). The most common cause of this condition is the virus that causes the common cold. Bacteria. Substances that bother (irritate) the lungs, including: Smoke from cigarettes and other types of tobacco. Dust and pollen. Fumes from chemicals, gases, or burned fuel. Indoor or outdoor air pollution. What increases the risk? A weak body's defense system. This is also called the immune system. Any condition that affects your lungs and breathing, such as asthma. What are the signs or symptoms? A cough. Coughing up clear, yellow, or green mucus. Making high-pitched whistling sounds when you breathe, most often when you breathe out (wheezing). Runny or stuffy nose. Having too much mucus in your lungs (chest congestion). Shortness of breath. Body aches. A sore throat. How is this treated? Acute bronchitis may go away over time without treatment. Your doctor may tell you to: Drink more fluids. This will help thin your mucus so it is easier to cough up. Use a device that gets medicine into your lungs (inhaler). Use a vaporizer or a humidifier. These are machines that add water to the air. This helps with coughing and poor breathing. Take a medicine that thins mucus and helps clear it from your lungs. Take a medicine that prevents or stops coughing. It is not common to take an antibiotic medicine for this condition. Follow these instructions at home:  Take over-the-counter and prescription medicines only as told by your doctor. Use an inhaler, vaporizer, or humidifier as told by your doctor. Take two teaspoons (10 mL) of honey at bedtime. This helps lessen  your coughing at night. Drink enough fluid to keep your pee (urine) pale yellow. Do not smoke or use any products that contain nicotine or tobacco. If you need help quitting, ask your doctor. Get a lot of rest. Return to your normal activities when your doctor says that it is safe. Keep all follow-up visits. How is this prevented?  Wash your hands often with soap and water for at least 20 seconds. If you cannot use soap and water, use hand sanitizer. Avoid contact with people who have cold symptoms. Try not to touch your mouth, nose, or eyes with your hands. Avoid breathing in smoke or chemical fumes. Make sure to get the flu shot every year. Contact a doctor if: Your symptoms do not get better in 2 weeks. You have trouble coughing up the mucus. Your cough keeps you awake at night. You have a  fever. Get help right away if: You cough up blood. You have chest pain. You have very bad shortness of breath. You faint or keep feeling like you are going to faint. You have a very bad headache. Your fever or chills get worse. These symptoms may be an emergency. Get help right away. Call your local emergency services (911 in the [place] Do not wait to see if the symptoms will go away. Do not drive yourself to the hospital. Summary Acute bronchitis is when air tubes in the lungs (bronchi) suddenly get swollen. In adults, acute bronchitis usually goes away within 2 weeks. Drink more fluids. This will help thin your mucus so it is easier to cough up. Take over-the-counter and prescription medicines only as told by your doctor. Contact a doctor if your symptoms do not improve after 2 weeks of treatment. This information is not intended to replace advice given to you by your health care provider. Make sure you discuss any questions you have with your health care provider. Document Revised: 07/10/2020 Document Reviewed: 07/10/2020 Elsevier Patient Education  2024 Elsevier Inc.  Sinus Infection,  Adult A sinus infection is soreness and swelling (inflammation) of your sinuses. Sinuses are hollow spaces in the bones around your face. They are located: Around your eyes. In the middle of your forehead. Behind your nose. In your cheekbones. Your sinuses and nasal passages are lined with a fluid called mucus. Mucus drains out of your sinuses. Swelling can trap mucus in your sinuses. This lets germs (bacteria, virus, or fungus) grow, which leads to infection. Most of the time, this condition is caused by a virus. What are the causes? Allergies. Asthma. Germs. Things that block your nose or sinuses. Growths in the nose (nasal polyps). Chemicals or irritants in the air. A fungus. This is rare. What increases the risk? Having a weak body defense system (immune system). Doing a lot of swimming or diving. Using nasal sprays too much. Smoking. What are the signs or symptoms? The main symptoms of this condition are pain and a feeling of pressure around the sinuses. Other symptoms include: Stuffy nose (congestion). This may make it hard to breathe through your nose. Runny nose (drainage). Soreness, swelling, and warmth in the sinuses. A cough that may get worse at night. Being unable to smell and taste. Mucus that collects in the throat or the back of the nose (postnasal drip). This may cause a sore throat or bad breath. Being very tired (fatigued). A fever. How is this diagnosed? Your symptoms. Your medical history. A physical exam. Tests to find out if your condition is short-term (acute) or long-term (chronic). Your doctor may: Check your nose for growths (polyps). Check your sinuses using a tool that has a light on one end (endoscope). Check for allergies or germs. Do imaging tests, such as an MRI or CT scan. How is this treated? Treatment for this condition depends on the cause and whether it is short-term or long-term. If caused by a virus, your symptoms should go away on  their own within 10 days. You may be given medicines to relieve symptoms. They include: Medicines that shrink swollen tissue in the nose. A spray that treats swelling of the nostrils. Rinses that help get rid of thick mucus in your nose (nasal saline washes). Medicines that treat allergies (antihistamines). Over-the-counter pain relievers. If caused by bacteria, your doctor may wait to see if you will get better without treatment. You may be given antibiotic medicine if you have:  A very bad infection. A weak body defense system. If caused by growths in the nose, surgery may be needed. Follow these instructions at home: Medicines Take, use, or apply over-the-counter and prescription medicines only as told by your doctor. These may include nasal sprays. If you were prescribed an antibiotic medicine, take it as told by your doctor. Do not stop taking it even if you start to feel better. Hydrate and humidify  Drink enough water to keep your pee (urine) pale yellow. Use a cool mist humidifier to keep the humidity level in your home above 50%. Breathe in steam for 10-15 minutes, 3-4 times a day, or as told by your doctor. You can do this in the bathroom while a hot shower is running. Try not to spend time in cool or dry air. Rest Rest as much as you can. Sleep with your head raised (elevated). Make sure you get enough sleep each night. General instructions  Put a warm, moist washcloth on your face 3-4 times a day, or as often as told by your doctor. Use nasal saline washes as often as told by your doctor. Wash your hands often with soap and water. If you cannot use soap and water, use hand sanitizer. Do not smoke. Avoid being around people who are smoking (secondhand smoke). Keep all follow-up visits. Contact a doctor if: You have a fever. Your symptoms get worse. Your symptoms do not get better within 10 days. Get help right away if: You have a very bad headache. You cannot stop  vomiting. You have very bad pain or swelling around your face or eyes. You have trouble seeing. You feel confused. Your neck is stiff. You have trouble breathing. These symptoms may be an emergency. Get help right away. Call 911. Do not wait to see if the symptoms will go away. Do not drive yourself to the hospital. Summary A sinus infection is swelling of your sinuses. Sinuses are hollow spaces in the bones around your face. This condition is caused by tissues in your nose that become inflamed or swollen. This traps germs. These can lead to infection. If you were prescribed an antibiotic medicine, take it as told by your doctor. Do not stop taking it even if you start to feel better. Keep all follow-up visits. This information is not intended to replace advice given to you by your health care provider. Make sure you discuss any questions you have with your health care provider. Document Revised: 02/11/2021 Document Reviewed: 02/11/2021 Elsevier Patient Education  2024 Elsevier Inc.   If you have been instructed to have an in-person evaluation today at a local Urgent Care facility, please use the link below. It will take you to a list of all of our available [facility], including address, phone number and hours of operation. Please do not delay care.  [facility]  If you or a family member do not have a primary care provider, use the link below to schedule a visit and establish care. When you choose a [facility] primary care physician or advanced practice provider, you gain a long-term partner in health. Find a Primary Care Provider  Learn more about [facility]'s in-office and virtual care options: [facility] - Get Care Now

## 2023-05-31 NOTE — Progress Notes (Signed)
 Virtual Visit Consent   John Clay, you are scheduled for a virtual visit with a [facility] provider today. Just as with appointments in the office, your consent must be obtained to participate. Your consent will be active for this visit and any virtual visit you may have with one of our providers in the next 365 days. If you have a MyChart account, a copy of this consent can be sent to you electronically.  As this is a virtual visit, video technology does not allow for your provider to perform a traditional examination. This may limit your provider's ability to fully assess your condition. If your provider identifies any concerns that need to be evaluated in person or the need to arrange testing (such as labs, EKG, etc.), we will make arrangements to do so. Although advances in technology are sophisticated, we cannot ensure that it will always work on either your end or our end. If the connection with a video visit is poor, the visit may have to be switched to a telephone visit. With either a video or telephone visit, we are not always able to ensure that we have a secure connection.  By engaging in this virtual visit, you consent to the provision of healthcare and authorize for your insurance to be billed (if applicable) for the services provided during this visit. Depending on your insurance coverage, you may receive a charge related to this service.  I need to obtain your verbal consent now. Are you willing to proceed with your visit today? John Clay has provided verbal consent on 05/31/2023 for a virtual visit (video or telephone). Margaretann Loveless, PA-C  Date: 05/31/2023 4:53 PM   Virtual Visit via Video Note   I, Margaretann Loveless, connected with  John Clay  ([id number], [birth year]) on 05/31/23 at  4:45 PM EDT by a video-enabled telemedicine application and verified that I am speaking with the correct person using two identifiers.  Location: Patient: Virtual  Visit Location Patient: Home Provider: Virtual Visit Location Provider: Home Office   I discussed the limitations of evaluation and management by telemedicine and the availability of in person appointments. The patient expressed understanding and agreed to proceed.    History of Present Illness: John Clay is a 63 y.o. who identifies as a male who was assigned male at birth, and is being seen today for cough and congestion.  HPI: Cough This is a new problem. The current episode started 1 to 4 weeks ago. The problem has been gradually worsening. The problem occurs every few minutes. Cough characteristics: mixed. Associated symptoms include chest pain, headaches, nasal congestion, rhinorrhea, a sore throat (initial, improved) and wheezing. Pertinent negatives include no chills, ear congestion, ear pain, fever, myalgias, postnasal drip, shortness of breath or sweats. The symptoms are aggravated by lying down. He has tried a beta-agonist inhaler (dayquil, albuterol) for the symptoms. The treatment provided no relief. His past medical history is significant for asthma, bronchitis and pneumonia.      Problems:  Patient Active Problem List   Diagnosis Date Noted   Mallet finger of left hand 04/15/2022   Aortic valve calcification 12/15/2021   Hyperlipidemia 03/03/2021   Nonrheumatic aortic valve stenosis 02/05/2021   Trifascicular block 02/05/2021   Abnormal EKG 11/15/2020   Bruit of left carotid artery 11/15/2020   Tubular adenoma of colon 03/15/2019   MVP (mitral valve prolapse) 04/26/2017   Hypogonadism in male 04/26/2017   Acquired hypothyroidism 04/26/2017   Seasonal allergic  rhinitis due to pollen 04/26/2017   History of Hodgkin's lymphoma 02/19/2016   Essential hypertension 02/19/2016   OSA (obstructive sleep apnea) 12/15/2012    Allergies:  No Known Allergies  Medications:  Current Outpatient Medications:    amoxicillin-clavulanate (AUGMENTIN) 875-125 MG tablet, Take 1  tablet by mouth 2 (two) times daily., Disp: 20 tablet, Rfl: 0   predniSONE (DELTASONE) 20 MG tablet, Take 2 tablets (40 mg total) by mouth daily with breakfast., Disp: 10 tablet, Rfl: 0   promethazine-dextromethorphan (PROMETHAZINE-DM) 6.25-15 MG/5ML syrup, Take 5 mLs by mouth 4 (four) times daily as needed., Disp: 118 mL, Rfl: 0   albuterol (VENTOLIN HFA) 108 (90 Base) MCG/ACT inhaler, Inhale 2 puffs into the lungs every 4 (four) hours as needed for wheezing or shortness of breath., Disp: 8 each, Rfl: 0   Albuterol-Budesonide (AIRSUPRA) 90-80 MCG/ACT AERO, Inhale 2 puffs into the lungs every 6 (six) hours as needed., Disp: 10.7 g, Rfl: 0   ALPRAZolam (XANAX) 0.25 MG tablet, Take 1 tablet (0.25 mg total) by mouth 2 (two) times daily as needed for anxiety., Disp: 20 tablet, Rfl: 0   amLODipine (NORVASC) 10 MG tablet, TAKE 1 TABLET BY MOUTH EVERY DAY, Disp: 90 tablet, Rfl: 3   levothyroxine (SYNTHROID) 88 MCG tablet, Take 1 tablet (88 mcg total) by mouth daily., Disp: 90 tablet, Rfl: 1   losartan (COZAAR) 25 MG tablet, Take 1 tablet (25 mg total) by mouth daily., Disp: 90 tablet, Rfl: 3   metoprolol succinate (TOPROL-XL) 50 MG 24 hr tablet, TAKE 1 TABLET BY MOUTH DAILY WITH OR IMMEDIATELY FOLLOWING A MEAL., Disp: 90 tablet, Rfl: 3   Multiple Vitamin (MULTI-VITAMINS) TABS, Take 1 tablet by mouth daily., Disp: , Rfl:    rosuvastatin (CRESTOR) 40 MG tablet, Take 1 tablet (40 mg total) by mouth daily., Disp: 90 tablet, Rfl: 3   Testosterone 30 MG/ACT SOLN, Apply 6 Pump topically daily. One month supply, Disp: 180 mL, Rfl: 5  Observations/Objective: Patient is well-developed, well-nourished in no acute distress.  Resting comfortably at home.  Head is normocephalic, atraumatic.  No labored breathing.  Speech is clear and coherent with logical content.  Patient is alert and oriented at baseline.    Assessment and Plan: 1. Sinobronchitis (Primary) - Albuterol-Budesonide (AIRSUPRA) 90-80 MCG/ACT AERO;  Inhale 2 puffs into the lungs every 6 (six) hours as needed.  Dispense: 10.7 g; Refill: 0 - albuterol (VENTOLIN HFA) 108 (90 Base) MCG/ACT inhaler; Inhale 2 puffs into the lungs every 4 (four) hours as needed for wheezing or shortness of breath.  Dispense: 8 each; Refill: 0 - amoxicillin-clavulanate (AUGMENTIN) 875-125 MG tablet; Take 1 tablet by mouth 2 (two) times daily.  Dispense: 20 tablet; Refill: 0 - predniSONE (DELTASONE) 20 MG tablet; Take 2 tablets (40 mg total) by mouth daily with breakfast.  Dispense: 10 tablet; Refill: 0 - promethazine-dextromethorphan (PROMETHAZINE-DM) 6.25-15 MG/5ML syrup; Take 5 mLs by mouth 4 (four) times daily as needed.  Dispense: 118 mL; Refill: 0  - Worsening over a week despite OTC medications - Will treat with Augmentin - Add Promethazine DM for cough - Prednisone for asthma exacerbation - Albuterol and Airsupra refilled for patient - Can continue Mucinex (PLAIN) - Push fluids.  - Rest.  - Steam and humidifier can help - Seek in person evaluation if worsening or symptoms fail to improve    Follow Up Instructions: I discussed the assessment and treatment plan with the patient. The patient was provided an opportunity to ask questions and all  were answered. The patient agreed with the plan and demonstrated an understanding of the instructions.  A copy of instructions were sent to the patient via MyChart unless otherwise noted below.    The patient was advised to call back or seek an in-person evaluation if the symptoms worsen or if the condition fails to improve as anticipated.    Margaretann Loveless, PA-C

## 2023-06-15 ENCOUNTER — Other Ambulatory Visit: Payer: Self-pay | Admitting: Family Medicine

## 2023-06-15 DIAGNOSIS — E291 Testicular hypofunction: Secondary | ICD-10-CM

## 2023-06-15 NOTE — Telephone Encounter (Signed)
 Is this okay to refill?

## 2023-07-07 ENCOUNTER — Other Ambulatory Visit: Payer: Self-pay | Admitting: Family Medicine

## 2023-07-07 DIAGNOSIS — I10 Essential (primary) hypertension: Secondary | ICD-10-CM

## 2023-07-28 ENCOUNTER — Ambulatory Visit: Payer: No Typology Code available for payment source | Attending: Internal Medicine | Admitting: Internal Medicine

## 2023-07-28 ENCOUNTER — Encounter: Payer: Self-pay | Admitting: Internal Medicine

## 2023-07-28 VITALS — BP 128/60 | HR 77 | Ht 75.0 in | Wt 226.0 lb

## 2023-07-28 DIAGNOSIS — I2584 Coronary atherosclerosis due to calcified coronary lesion: Secondary | ICD-10-CM

## 2023-07-28 DIAGNOSIS — I10 Essential (primary) hypertension: Secondary | ICD-10-CM | POA: Diagnosis not present

## 2023-07-28 DIAGNOSIS — I359 Nonrheumatic aortic valve disorder, unspecified: Secondary | ICD-10-CM | POA: Diagnosis not present

## 2023-07-28 DIAGNOSIS — I2510 Atherosclerotic heart disease of native coronary artery without angina pectoris: Secondary | ICD-10-CM

## 2023-07-28 DIAGNOSIS — I5189 Other ill-defined heart diseases: Secondary | ICD-10-CM

## 2023-07-28 MED ORDER — AMLODIPINE BESYLATE 5 MG PO TABS: 5.0000 mg | ORAL_TABLET | Freq: Every day | ORAL | 3 refills | Status: AC

## 2023-07-28 NOTE — Patient Instructions (Signed)
 Medication Instructions:  Your physician has recommended you make the following change in your medication:  DECREASE: amlodipine  (Norvasc ) to 5 mg by mouth once daily  *If you need a refill on your cardiac medications before your next appointment, please call your pharmacy*  Lab Work: NONE  If you have labs (blood work) drawn today and your tests are completely normal, you will receive your results only by: MyChart Message (if you have MyChart) OR A paper copy in the mail If you have any lab test that is abnormal or we need to change your treatment, we will call you to review the results.  Testing/Procedures: FEB 2026- - - Your physician has requested that you have an echocardiogram. Echocardiography is a painless test that uses sound waves to create images of your heart. It provides your doctor with information about the size and shape of your heart and how well your heart's chambers and valves are working. This procedure takes approximately one hour. There are no restrictions for this procedure. Please do NOT wear cologne, perfume, aftershave, or lotions (deodorant is allowed). Please arrive 15 minutes prior to your appointment time.  Please note: We ask at that you not bring children with you during ultrasound (echo/ vascular) testing. Due to room size and safety concerns, children are not allowed in the ultrasound rooms during exams. Our front office staff cannot provide observation of children in our lobby area while testing is being conducted. An adult accompanying a patient to their appointment will only be allowed in the ultrasound room at the discretion of the ultrasound technician under special circumstances. We apologize for any inconvenience.   Follow-Up: At [facility], you and your health needs are our priority.  As part of our continuing mission to provide you with exceptional heart care, our providers are all part of one team.  This team includes your primary  Cardiologist (physician) and Advanced Practice Providers or APPs (Physician Assistants and Nurse Practitioners) who all work together to provide you with the care you need, when you need it.  Your next appointment:   9 month(s)  Provider:    Gloriann Larger, MD

## 2023-07-28 NOTE — Progress Notes (Signed)
 Cardiology Office Note:  .    Date:  07/28/2023  ID:  John Clay, DOB January 14, 1961, MRN [id number] PCP: Watson Hacking, MD  [facility] Providers Cardiologist:  None     CC: Transition to new cardiologist  History of Present Illness: .    John Clay is a 63 y.o. male  with aortic valve disease and trifascicular block who presents for evaluation of his valve disease and heart block. He was previously seen by Dr. Ollie Bhat for evaluation of his cardiac conditions.  He has a history of multiple valve diseases, including moderate aortic regurgitation, moderate aortic stenosis, mild mitral regurgitation, and mild tricuspid regurgitation. His aortic valve disease is mixed and trileaflet in nature. He experiences intermittent shortness of breath, which improves with the use of albuterol , taken three to four times daily. He engages in regular physical activity, including gym workouts, without significant limitation from his symptoms.  He has a history of trifascicular block, including a new first-degree AV block and a right bundle branch block with left anterior fascicular block. No red flag symptoms such as chest pain or syncope are present. His blood pressure is well controlled on amlodipine , losartan , and metoprolol .  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 He also has a history of post-splenectomy and minimal carotid artery plaques. His cholesterol is well managed on rosuvastatin .  He experiences chronic congestion, which he attributes to hay fever and allergies, particularly to ragweed and pine pollen. His symptoms improve when his allergies are better managed.  Discussed the use of AI scribe software for clinical note transcription with the patient, who gave verbal consent to proceed.  Relevant histories: .  Social: former Dr. Felipe Horton patient; personal friend ROS: As  per HPI.   Studies Reviewed: .   Cardiac Studies & Procedures   ______________________________________________________________________________________________   STRESS TESTS  MYOCARDIAL PERFUSION IMAGING 11/20/2020  Narrative   Findings are consistent with no prior ischemia. The study is low risk.   No ST deviation from baseline EKG was noted.   LV perfusion is abnormal. There is no evidence of ischemia. There is no evidence of infarction.   Defect 1: There is a small defect with mild reduction in uptake present in the apical inferior location(s) that is fixed. There is normal wall motion in the defect area. Consistent with artifact caused by bowel tracer uptake and diaphragmatic attenuation.   Nuclear stress EF: 56 %. The left ventricular ejection fraction is normal (55-65%). Left ventricular function is normal. End diastolic cavity size is mildly enlarged. End systolic cavity size is mildly enlarged.   ECHOCARDIOGRAM  ECHOCARDIOGRAM COMPLETE 02/01/2023  Narrative ECHOCARDIOGRAM REPORT    Patient Name:   John Clay Date of Exam: 02/01/2023 Medical Rec #:  [id number]          Height:       75.0 in Accession #:    [id number]         Weight:       225.4 lb Date of Birth:  1960-12-14           BSA:          2.309 m Patient Age:    62 years           BP:           144/86 mmHg Patient Gender: M  HR:           81 bpm. Exam Location:  [address]  Procedure: 2D Echo, Cardiac Doppler and Color Doppler  Indications:    I35.0 Aortic Stenosis  History:        Patient has prior history of Echocardiogram examinations, most recent 02/02/2022. Arrythmias:RBBB and !st degree AV block, Signs/Symptoms:Palpitations; Risk Factors:Hypertension.  Sonographer:    Juventino Oppenheim RCS Referring Phys: [phone] Durenda Gilford [facility]  IMPRESSIONS   1. Left ventricular ejection fraction, by estimation, is 60 to 65%. The left ventricle has normal function. The left ventricle has no  regional wall motion abnormalities. Left ventricular diastolic parameters are indeterminate. Elevated left ventricular end-diastolic pressure. 2. Right ventricular systolic function is normal. The right ventricular size is normal. There is normal pulmonary artery systolic pressure. 3. Progressive mitral valve thickening compared with echo 11/2020. The mitral valve is degenerative. Trivial mitral valve regurgitation. No evidence of mitral stenosis. The mean mitral valve gradient is 4.0 mmHg with average heart rate of 76 bpm. 4. The aortic valve is calcified. There is moderate calcification of the aortic valve. There is moderate thickening of the aortic valve. Aortic valve regurgitation is mild to moderate. Mild to moderate aortic valve stenosis. Aortic valve area, by VTI measures 1.22 cm. Aortic valve mean gradient measures 18.0 mmHg. Aortic valve Vmax measures 2.76 m/s. 5. Pulmonic valve regurgitation is severe. 6. The inferior vena cava is normal in size with greater than 50% respiratory variability, suggesting right atrial pressure of 3 mmHg.  FINDINGS Left Ventricle: Left ventricular ejection fraction, by estimation, is 60 to 65%. The left ventricle has normal function. The left ventricle has no regional wall motion abnormalities. The left ventricular internal cavity size was normal in size. There is no left ventricular hypertrophy. Left ventricular diastolic parameters are indeterminate. Elevated left ventricular end-diastolic pressure.  Right Ventricle: The right ventricular size is normal. No increase in right ventricular wall thickness. Right ventricular systolic function is normal. There is normal pulmonary artery systolic pressure. The tricuspid regurgitant velocity is 2.20 m/s, and with an assumed right atrial pressure of 3 mmHg, the estimated right ventricular systolic pressure is 22.4 mmHg.  Left Atrium: Left atrial size was normal in size.  Right Atrium: Right atrial size was normal in  size.  Pericardium: There is no evidence of pericardial effusion.  Mitral Valve: Progressive mitral valve thickening compared with echo 11/2020. The mitral valve is degenerative in appearance. There is moderate thickening of the mitral valve leaflet(s). There is moderate calcification of the mitral valve leaflet(s). Trivial mitral valve regurgitation. No evidence of mitral valve stenosis. The mean mitral valve gradient is 4.0 mmHg with average heart rate of 76 bpm.  Tricuspid Valve: The tricuspid valve is normal in structure. Tricuspid valve regurgitation is mild . No evidence of tricuspid stenosis.  Aortic Valve: The aortic valve is calcified. There is moderate calcification of the aortic valve. There is moderate thickening of the aortic valve. Aortic valve regurgitation is mild to moderate. Mild to moderate aortic stenosis is present. Aortic valve mean gradient measures 18.0 mmHg. Aortic valve peak gradient measures 30.5 mmHg. Aortic valve area, by VTI measures 1.22 cm.  Pulmonic Valve: The pulmonic valve was normal in structure. Pulmonic valve regurgitation is severe. No evidence of pulmonic stenosis.  Aorta: The aortic root is normal in size and structure.  Venous: The inferior vena cava is normal in size with greater than 50% respiratory variability, suggesting right atrial pressure of 3 mmHg.  IAS/Shunts:  No atrial level shunt detected by color flow Doppler.   LEFT VENTRICLE PLAX 2D LVIDd:         4.20 cm   Diastology LVIDs:         2.80 cm   LV e' medial:    8.38 cm/s LV PW:         1.10 cm   LV E/e' medial:  14.9 LV IVS:        0.80 cm   LV e' lateral:   5.55 cm/s LVOT diam:     2.10 cm   LV E/e' lateral: 22.5 LV SV:         79 LV SV Index:   34 LVOT Area:     3.46 cm   RIGHT VENTRICLE RV Basal diam:  3.50 cm RV S prime:     10.40 cm/s TAPSE (M-mode): 2.7 cm RVSP:           22.4 mmHg  LEFT ATRIUM             Index        RIGHT ATRIUM           Index LA diam:         3.00 cm 1.30 cm/m   RA Pressure: 3.00 mmHg LA Vol (A2C):   60.3 ml 26.12 ml/m  RA Area:     15.70 cm LA Vol (A4C):   56.0 ml 24.26 ml/m  RA Volume:   46.50 ml  20.14 ml/m LA Biplane Vol: 59.8 ml 25.90 ml/m AORTIC VALVE AV Area (Vmax):    1.15 cm AV Area (Vmean):   1.16 cm AV Area (VTI):     1.22 cm AV Vmax:           276.00 cm/s AV Vmean:          195.000 cm/s AV VTI:            0.646 m AV Peak Grad:      30.5 mmHg AV Mean Grad:      18.0 mmHg LVOT Vmax:         91.40 cm/s LVOT Vmean:        65.450 cm/s LVOT VTI:          0.227 m LVOT/AV VTI ratio: 0.35  AORTA Ao Root diam: 3.20 cm Ao Asc diam:  3.00 cm  MITRAL VALVE                TRICUSPID VALVE MV Area (PHT):              TR Peak grad:   19.4 mmHg MV Mean grad:  4.0 mmHg     TR Vmax:        220.00 cm/s MV Decel Time:              Estimated RAP:  3.00 mmHg MV E velocity: 125.00 cm/s  RVSP:           22.4 mmHg MV A velocity: 117.00 cm/s MV E/A ratio:  1.07         SHUNTS Systemic VTI:  0.23 m Systemic Diam: 2.10 cm  Maudine Sos MD Electronically signed by Maudine Sos MD Signature Date/Time: 02/01/2023/12:37:59 PM    Final        CARDIAC MRI  MR CARDIAC MORPHOLOGY W WO CONTRAST 04/26/2023  Narrative CLINICAL DATA:  Clinical question of multiple valve disease Study assumes HCT of 44 and BSA of 2.23 m2.  EXAM: CARDIAC MRI  TECHNIQUE: The  patient was scanned on a 1.5 Tesla GE magnet. A dedicated cardiac coil was used. Functional imaging was done using Fiesta sequences. 2,3, and 4 chamber views were done to assess for RWMA's. Modified Simpson's rule using a short axis stack was used to calculate an ejection fraction on a dedicated work [employer]. The patient received 10 cc of Gadavist . After 10 minutes inversion recovery sequences were used to assess for infiltration and scar tissue. Flow quantification was performed 2 times during this examination with flow  quantification performed at the levels of the ascending aorta above the valve, pulmonary artery above the valve.  CONTRAST:  10 cc  of Gadavist   FINDINGS: 1. Normal left ventricular size, with LVEDD 55 mm, and LVEDVi 72 mL/m2.  Mild increase in left ventricular thickness, with intraventricular septal thickness of 12 mm , posterior wall thickness of 9 mm, and myocardial mass index of 59 g/m2.  Low normal left ventricular systolic function (LVEF =52%). There are no regional wall motion abnormalities.  Left ventricular parametric mapping notable for normal T2. ECV elevated at RV insertion point (44%).  Normal resting perfusion.  There is no late gadolinium enhancement in the left ventricular myocardium.  2. Normal right ventricular size with RVEDVI 61 mL/m2.  Normal right ventricular thickness.  Normal right ventricular systolic function (RVEF =52%). There are no regional wall motion abnormalities or aneurysms.  3. Normal left and right atrial size. Velocity encoding jet suggestive of small PFO.  4. Normal size of the aortic root, ascending aorta and pulmonary artery.  5. Valve assessment:  Aortic Valve: Calcified aortic valve with calcific leaflet tips. Tri-leaflet valve. Moderate regurgitation. Regurgitant fraction 19%. 2D Valve area 1.41 cm2.  Pulmonic Valve: Possible prolapse; artifact at the level of the pulmonic valve due to jet aliasing. Mild regurgitation. Regurgitant fraction 5%.  Tricuspid Valve:Mild regurgitation.  Regurgitant fraction 13%.  Mitral Valve:Thickened anterior leaflet with billowing. Mild regurgitation. Regurgitant fraction 6%.  6.  Normal pericardium.  No pericardial effusion.  7. Minimal dependent atelectasis bilaterally. Recommended dedicated study if concerned for non-cardiac pathology.  IMPRESSION: 1.  Low normal LVEF by volume, LVEF 52%.  2. Moderate aortic regurgitation, calcified aortic valve. Suspect moderate aortic  stenosis.  3.  Mild pulmonic regurgitation.  Gloriann Larger MD   Electronically Signed By: Gloriann Larger M.D. On: 04/26/2023 11:29   ______________________________________________________________________________________________      Physical Exam:    VS:  BP 128/60 (BP Location: Left Arm)   Pulse 77   Ht 6\' 3"  (1.905 m)   Wt 226 lb (102.5 kg)   SpO2 98%   BMI 28.25 kg/m    Wt Readings from Last 3 Encounters:  07/28/23 226 lb (102.5 kg)  12/03/22 225 lb 6.4 oz (102.2 kg)  09/02/22 224 lb (101.6 kg)    Gen: no distress   Neck: No JVD Cardiac: No Rubs or Gallops, Systolic and diastolic Murmurs, RRR, +2 radial pulses Respiratory: Clear to auscultation bilaterally, normal effort, normal  respiratory rate GI: Soft, nontender, non-distended  MS: No  edema;  moves all extremities Integument: Skin feels warm Neuro:  At time of evaluation, alert and oriented to person/place/time/situation  Psych: Normal affect, patient feels ok  ASSESSMENT AND PLAN: .    An EKG was ordered for Tri-fascicular block and shows trifascicular block with PR prolongation  Aortic stenosis and regurgitation Mixed aortic valve disease with moderate aortic stenosis and regurgitation, likely secondary to prior mantle radiation therapy. Asymptomatic with no immediate intervention  required. Discussed potential future need for surgical aortic valve replacement (SAVR) and subsequent transcatheter aortic valve replacement (TAVR) to extend valve function. Emphasized timing for valve replacement to maximize prosthetic valve longevity. Current dyspnea likely due to allergic rhinitis and asthma. Surgical evaluation will be considered if severe aortic stenosis or regurgitation develops. SAVR valves typically last 10-15 years, with TAVR as a future option. - Order echocardiogram in February 2026 - Monitor for new symptoms suggestive of worsening valve disease - Consider surgical evaluation if symptoms or  severity of valve disease increase (we would do TAVR CT's prior though my plan would be for SAVR save for prohibitive aorta with then VIV TAVR in the future)  Trifascicular block - Chronic trifascicular block without concerning symptoms. Condition is not expected to worsen unless valve surgery is required.  Can continue metoprolol   Hypertension Hypertension is well-controlled on amlodipine , losartan , and metoprolol . Reports ankle edema potentially related to amlodipine . - Reduce amlodipine  to 5 mg daily - Monitor blood pressure regularly - If blood pressure exceeds 130/80 mmHg, consider alternative antihypertensive therapy or revert to previous amlodipine  dose  CAC HLD - controlled on current medication  Asthma Allergic rhinitis Asthma with intermittent dyspnea, likely exacerbated by allergic rhinitis. Symptoms improve with albuterol . - Continue current asthma management with albuterol  as needed - Consider over-the-counter Flonase  for allergy management - Use saline irrigation or neti pot for nasal congestion relief  Gloriann Larger, MD FASE [facility] Cardiologist [facility]  [address] [place], [place] [address] [phone]  9:11 AM

## 2023-08-01 ENCOUNTER — Other Ambulatory Visit: Payer: Self-pay | Admitting: Family Medicine

## 2023-08-01 DIAGNOSIS — E039 Hypothyroidism, unspecified: Secondary | ICD-10-CM

## 2023-08-15 ENCOUNTER — Other Ambulatory Visit: Payer: Self-pay | Admitting: Family Medicine

## 2023-08-15 DIAGNOSIS — E291 Testicular hypofunction: Secondary | ICD-10-CM

## 2023-08-25 ENCOUNTER — Other Ambulatory Visit: Payer: Self-pay | Admitting: Family Medicine

## 2023-08-25 DIAGNOSIS — J329 Chronic sinusitis, unspecified: Secondary | ICD-10-CM

## 2023-08-25 MED ORDER — ALBUTEROL SULFATE HFA 108 (90 BASE) MCG/ACT IN AERS: 2.0000 | INHALATION_SPRAY | RESPIRATORY_TRACT | 0 refills | Status: DC | PRN

## 2023-08-25 NOTE — Telephone Encounter (Unsigned)
 Copied from CRM [id number]. Topic: Clinical - Medication Refill >> Aug 25, 2023  4:00 PM Everette C wrote: Medication: albuterol  (VENTOLIN  HFA) 108 (90 Base) MCG/ACT inhaler [[id number]]  Has the patient contacted their pharmacy? Yes (Agent: If no, request that the patient contact the pharmacy for the refill. If patient does not wish to contact the pharmacy document the reason why and proceed with request.) (Agent: If yes, when and what did the pharmacy advise?)  This is the patient's preferred pharmacy:  [id number] [place], [place] - [address] [place] [address] Phone: [phone] Fax: [phone]  Is this the correct pharmacy for this prescription? Yes If no, delete pharmacy and type the correct one.   Has the prescription been filled recently? Yes  Is the patient out of the medication? Yes  Has the patient been seen for an appointment in the last year OR does the patient have an upcoming appointment? Yes  Can we respond through MyChart? No  Agent: Please be advised that Rx refills may take up to 3 business days. We ask that you follow-up with your pharmacy.

## 2023-09-15 ENCOUNTER — Encounter: Payer: No Typology Code available for payment source | Admitting: Family Medicine

## 2023-09-22 ENCOUNTER — Other Ambulatory Visit: Payer: Self-pay

## 2023-09-22 ENCOUNTER — Telehealth: Payer: Self-pay

## 2023-09-22 ENCOUNTER — Ambulatory Visit: Payer: Self-pay

## 2023-09-22 ENCOUNTER — Other Ambulatory Visit: Payer: Self-pay | Admitting: Family Medicine

## 2023-09-22 DIAGNOSIS — J329 Chronic sinusitis, unspecified: Secondary | ICD-10-CM

## 2023-09-22 MED ORDER — ALBUTEROL SULFATE HFA 108 (90 BASE) MCG/ACT IN AERS: 2.0000 | INHALATION_SPRAY | RESPIRATORY_TRACT | 0 refills | Status: AC | PRN

## 2023-09-22 MED ORDER — AIRSUPRA 90-80 MCG/ACT IN AERO: 2.0000 | INHALATION_SPRAY | Freq: Four times a day (QID) | RESPIRATORY_TRACT | 0 refills | Status: DC | PRN

## 2023-09-22 NOTE — Telephone Encounter (Signed)
 Copied from CRM [id number]. Topic: Clinical - Prescription Issue >> Sep 22, 2023  2:41 PM Graeme ORN wrote: Reason for CRM: Patient called. Spoke with nurse today and they were able to get Rx sent in by provider. Incorrect medication was sent in. Received Albuterol -Budesonide  (AIRSUPRA ) 90-80 MCG/ACT AERO but requested albuterol  (VENTOLIN  HFA) 108 (90 Base) MCG/ACT inhaler due to cost. Thank You

## 2023-09-22 NOTE — Telephone Encounter (Signed)
 FYI Only or Action Required?: FYI only for provider.  Patient was last seen in primary care on 12/03/2022 by Joyce Norleen BROCKS, MD. Called Nurse Triage reporting Shortness of Breath. Symptoms began several weeks ago. Interventions attempted: Prescription medications: albuterol . Symptoms are: unchanged.  Triage Disposition: See PCP Within 2 Weeks  Patient/caregiver understands and will follow disposition?:   Copied from CRM [id number]. Topic: Clinical - Red Word Triage >> Sep 22, 2023 11:11 AM Graeme ORN wrote: Red Word that prompted transfer to Nurse Triage: Increases/ worsening shortness of breath    ----------------------------------------------------------------------- From previous Reason for Contact - Cancel/Reschedule: Patient/patient representative is calling to cancel or reschedule an appointment. Refer to attachments for appointment information. Reason for Disposition  [1] MILD longstanding difficulty breathing AND [2]  SAME as normal  Answer Assessment - Initial Assessment Questions Last saw cardiology in May and was diagnosed with valve regurgitation, but not bad enough for intervention, monitoring only Also states that he is hodgkins lymphoma survivor with radiation to chest 40+ years ago States that albuterol  helps his shortness of breath. Uses albuterol  3-4 times per day, daily  1. RESPIRATORY STATUS: Describe your breathing? (e.g., wheezing, shortness of breath, unable to speak, severe coughing)      Shortness of breath 2. ONSET: When did this breathing problem begin?      Several weeks ago 3. PATTERN Does the difficult breathing come and go, or has it been constant since it started?      Most bothersome at night 4. SEVERITY: How bad is your breathing? (e.g., mild, moderate, severe)    - MILD: No SOB at rest, mild SOB with walking, speaks normally in sentences, can lie down, no retractions, pulse < 100.    - MODERATE: SOB at rest, SOB with minimal exertion and prefers  to sit, cannot lie down flat, speaks in phrases, mild retractions, audible wheezing, pulse 100-120.    - SEVERE: Very SOB at rest, speaks in single words, struggling to breathe, sitting hunched forward, retractions, pulse > 120      At times, SOB worsened with activity, reports that he walks 2-5 miles a couple of times per week 5. RECURRENT SYMPTOM: Have you had difficulty breathing before? If Yes, ask: When was the last time? and What happened that time?      yes 6. CARDIAC HISTORY: Do you have any history of heart disease? (e.g., heart attack, angina, bypass surgery, angioplasty)      Diagnosed in May with valve regurgitation 7. LUNG HISTORY: Do you have any history of lung disease?  (e.g., pulmonary embolus, asthma, emphysema)     Had respiratory issues with COVID in fall, 2024, but otherwise has not had lung issues 8. CAUSE: What do you think is causing the breathing problem?      Heart valve prolapse 9. OTHER SYMPTOMS: Do you have any other symptoms? (e.g., dizziness, runny nose, cough, chest pain, fever)     Productive cough 10. O2 SATURATION MONITOR:  Do you use an oxygen saturation monitor (pulse oximeter) at home? If Yes, ask: What is your reading (oxygen level) today? What is your usual oxygen saturation reading? (e.g., 95%)       Does not own  Protocols used: Breathing Difficulty-A-AH

## 2023-09-28 ENCOUNTER — Ambulatory Visit: Payer: Self-pay | Admitting: Family Medicine

## 2023-09-28 VITALS — BP 124/78 | HR 84 | Wt 233.8 lb

## 2023-09-28 DIAGNOSIS — Z8571 Personal history of Hodgkin lymphoma: Secondary | ICD-10-CM

## 2023-09-28 DIAGNOSIS — I359 Nonrheumatic aortic valve disorder, unspecified: Secondary | ICD-10-CM | POA: Diagnosis not present

## 2023-09-28 DIAGNOSIS — J45909 Unspecified asthma, uncomplicated: Secondary | ICD-10-CM

## 2023-09-28 MED ORDER — FLUTICASONE-SALMETEROL 100-50 MCG/ACT IN AEPB: 1.0000 | INHALATION_SPRAY | Freq: Two times a day (BID) | RESPIRATORY_TRACT | 3 refills | Status: DC

## 2023-09-28 NOTE — Patient Instructions (Signed)
 Leave a message on MyChart after about a month on how you are doing on this medicine.  If we need to we can go with the higher dose.  The goal would be that you would not need a rescue inhaler more than twice a week during the day or twice a month at night

## 2023-09-28 NOTE — Progress Notes (Signed)
   Subjective:    Patient ID: John Clay, male    DOB: 14-Jul-1960, 63 y.o.   MRN: [id number]  HPI He is here for consult concerning difficulty with wheezing and shortness of breath.  Initially the shortness of breath was mainly at night but he also has had difficulty with wheezing.  He did have a history of having asthma as a child but then outgrew it.  He states that this became more of an issue after he had COVID.  He also has an underlying history of Hodgkin's and cardiac issues that are probably related to the previous irradiation that he had because of that.  Review of the note from the cardiologist indicates regurg of several valves.  He has been seen by pulmonology and they gave him Airsupra  but the cost of that is more than he would like to deal with.  He states that he is using his medicine 2 or 3 times per day.   Review of Systems     Objective:    Physical Exam Alert and in no distress.  Cardiac exam does show a systolic murmur.  Lungs are clear to auscultation.       Assessment & Plan:  Mild asthma, unspecified whether complicated, unspecified whether persistent - Plan: fluticasone -salmeterol (ADVAIR) 100-50 MCG/ACT AEPB  History of Hodgkin's lymphoma  Aortic valve calcification I think that after COVID, his asthma started becoming more of an issue and if it is time to use a longer acting medication.  I will switch him to Advair.  Explained that the idea his for him to only use a rescue inhaler twice per week or twice per month at night.  He will call in about a month to let me know how he is doing on that medicine and if it is not adequately performing, I will increase it before he comes in to see me again.  He will continue to be followed by cardiology.  Recheck here in about 3 months.

## 2023-09-30 DIAGNOSIS — F4389 Other reactions to severe stress: Secondary | ICD-10-CM | POA: Diagnosis not present

## 2023-10-05 ENCOUNTER — Ambulatory Visit: Payer: Self-pay | Admitting: Family Medicine

## 2023-10-07 ENCOUNTER — Other Ambulatory Visit: Payer: Self-pay | Admitting: Family Medicine

## 2023-10-07 DIAGNOSIS — E782 Mixed hyperlipidemia: Secondary | ICD-10-CM

## 2023-10-13 ENCOUNTER — Encounter: Payer: Self-pay | Admitting: Family Medicine

## 2023-10-25 ENCOUNTER — Other Ambulatory Visit: Payer: Self-pay | Admitting: Family Medicine

## 2023-10-25 DIAGNOSIS — E291 Testicular hypofunction: Secondary | ICD-10-CM

## 2023-11-19 ENCOUNTER — Other Ambulatory Visit: Payer: Self-pay | Admitting: Family Medicine

## 2023-11-19 DIAGNOSIS — E291 Testicular hypofunction: Secondary | ICD-10-CM

## 2023-11-21 NOTE — Telephone Encounter (Unsigned)
 Copied from CRM [id number]. Topic: Clinical - Medication Refill >> Nov 19, 2023  3:57 PM Debby BROCKS wrote: Medication: Testosterone  30 MG/ACT SOLN  Has the patient contacted their pharmacy? Yes (Agent: If no, request that the patient contact the pharmacy for the refill. If patient does not wish to contact the pharmacy document the reason why and proceed with request.) (Agent: If yes, when and what did the pharmacy advise?)  This is the patient's preferred pharmacy:  [id number] [place], [place] - [address] [place] [address] Phone: [phone] Fax: [phone]  Is this the correct pharmacy for this prescription? Yes If no, delete pharmacy and type the correct one.   Has the prescription been filled recently? No  Is the patient out of the medication? Yes  Has the patient been seen for an appointment in the last year OR does the patient have an upcoming appointment? Yes  Can we respond through MyChart? Yes  Agent: Please be advised that Rx refills may take up to 3 business days. We ask that you follow-up with your pharmacy.

## 2023-11-23 MED ORDER — TESTOSTERONE 30 MG/ACT TD SOLN: 6.0000 | Freq: Every day | TRANSDERMAL | 1 refills | Status: DC

## 2023-11-24 ENCOUNTER — Encounter: Payer: Self-pay | Admitting: Family Medicine

## 2023-11-24 ENCOUNTER — Ambulatory Visit (INDEPENDENT_AMBULATORY_CARE_PROVIDER_SITE_OTHER): Admitting: Family Medicine

## 2023-11-24 VITALS — BP 124/72 | HR 87 | Ht 75.0 in | Wt 222.4 lb

## 2023-11-24 DIAGNOSIS — Z8571 Personal history of Hodgkin lymphoma: Secondary | ICD-10-CM

## 2023-11-24 DIAGNOSIS — R0989 Other specified symptoms and signs involving the circulatory and respiratory systems: Secondary | ICD-10-CM

## 2023-11-24 DIAGNOSIS — Z0000 Encounter for general adult medical examination without abnormal findings: Secondary | ICD-10-CM | POA: Diagnosis not present

## 2023-11-24 DIAGNOSIS — E039 Hypothyroidism, unspecified: Secondary | ICD-10-CM

## 2023-11-24 DIAGNOSIS — I453 Trifascicular block: Secondary | ICD-10-CM

## 2023-11-24 DIAGNOSIS — E291 Testicular hypofunction: Secondary | ICD-10-CM | POA: Diagnosis not present

## 2023-11-24 DIAGNOSIS — I10 Essential (primary) hypertension: Secondary | ICD-10-CM | POA: Diagnosis not present

## 2023-11-24 DIAGNOSIS — D126 Benign neoplasm of colon, unspecified: Secondary | ICD-10-CM

## 2023-11-24 DIAGNOSIS — G4733 Obstructive sleep apnea (adult) (pediatric): Secondary | ICD-10-CM | POA: Diagnosis not present

## 2023-11-24 DIAGNOSIS — J301 Allergic rhinitis due to pollen: Secondary | ICD-10-CM

## 2023-11-24 DIAGNOSIS — Z23 Encounter for immunization: Secondary | ICD-10-CM

## 2023-11-24 DIAGNOSIS — I350 Nonrheumatic aortic (valve) stenosis: Secondary | ICD-10-CM

## 2023-11-24 DIAGNOSIS — E782 Mixed hyperlipidemia: Secondary | ICD-10-CM | POA: Diagnosis not present

## 2023-11-24 DIAGNOSIS — I359 Nonrheumatic aortic valve disorder, unspecified: Secondary | ICD-10-CM | POA: Diagnosis not present

## 2023-11-24 DIAGNOSIS — I341 Nonrheumatic mitral (valve) prolapse: Secondary | ICD-10-CM

## 2023-11-24 DIAGNOSIS — Z125 Encounter for screening for malignant neoplasm of prostate: Secondary | ICD-10-CM | POA: Diagnosis not present

## 2023-11-24 LAB — LIPID PANEL

## 2023-11-24 MED ORDER — ROSUVASTATIN CALCIUM 40 MG PO TABS: 40.0000 mg | ORAL_TABLET | Freq: Every day | ORAL | 0 refills | Status: DC

## 2023-11-24 MED ORDER — LEVOTHYROXINE SODIUM 88 MCG PO TABS: 88.0000 ug | ORAL_TABLET | Freq: Every day | ORAL | 1 refills | Status: AC

## 2023-11-24 MED ORDER — TESTOSTERONE 30 MG/ACT TD SOLN: 4.0000 | Freq: Every day | TRANSDERMAL | 3 refills | Status: DC

## 2023-11-24 MED ORDER — METOPROLOL SUCCINATE ER 50 MG PO TB24: ORAL_TABLET | ORAL | 3 refills | Status: AC

## 2023-11-24 NOTE — Progress Notes (Signed)
 Complete physical exam  Patient: John Clay   DOB: 1960-08-13   63 y.o. Male  MRN: [id number]  Subjective:    Chief Complaint  Patient presents with   Annual Exam    Fasting- wants Flu shot   Discussed the use of AI scribe software for clinical note transcription with the patient, who gave verbal consent to proceed. Donnell K Hittle is a 63 y.o. male who presents today for a complete physical exam.  He reports consuming a general diet. Gym/ health club routine includes cardio. He generally feels  well. He reports sleeping fairly well.  He has experienced significant improvement in his asthma symptoms since starting a steroid inhaler (Advair) twice daily. He has not needed to use his albuterol  inhaler since beginning this treatment, whereas previously he was using it three times a day. He is concerned about potential lung damage or COPD due to his history of Hodgkin's lymphoma 20 years ago and reports he is no longer getting regular x-rays for follow-up. No current respiratory distress or need for albuterol  inhaler.  He mentions a recent incident where he experienced significant pain in his right knee after a misstep, suspecting a possible meniscus tear. However, the pain has since resolved, and he has no current knee complaints. No current knee pain. He continues on Synthroid  and is having no difficulty with that. He also continues on testosterone  and definitely knows an improvement when he takes this regularly in terms of energy, strength and stamina. He has a history of aortic valve stenosis and mitral valve issues, fascicular block and reports seeing his cardiologist for follow-up. He underwent an echocardiogram in May and is aware of aortic valve calcification and carotid artery issues.  He is currently taking Crestor  10 mg for cholesterol management.  He has resumed using his CPAP machine over the past year after noticing improved rest and reduced daytime fatigue. He had  previously stopped using it, thinking he had outgrown the need. Reports improved rest with CPAP use.  For allergies, he takes over-the-counter Allegra D.      Most recent fall risk assessment:    11/24/2023    9:22 AM  Fall Risk   Falls in the past year? 0  Number falls in past yr: 0  Injury with Fall? 0  Risk for fall due to : No Fall Risks  Follow up Falls evaluation completed     Most recent depression screenings:    11/24/2023    9:22 AM 09/02/2022   10:18 AM  PHQ 2/9 Scores  PHQ - 2 Score 0 1    Dentist: Tobias Loving 11/24 Cardiologist: Stanly Leavens 07/2023 Last visit Colonoscopy: 04/2022 Vision: Looking to get a Eye Dr.     Immunization History  Administered Date(s) Administered   Influenza Inj Mdck Quad Pf 12/23/2016   Influenza Split 01/06/2012, 12/20/2012   Influenza, Quadrivalent, Recombinant, Inj, Pf 12/07/2017   Influenza, Seasonal, Injecte, Preservative Fre 12/02/2022, 11/24/2023   Influenza,inj,Quad PF,6+ Mos 12/12/2019, 01/08/2021, 12/03/2021   Influenza-Unspecified 11/30/2016, 12/23/2016, 12/07/2017, 12/05/2018   PFIZER Comirnaty(Gray Top)Covid-19 Tri-Sucrose Vaccine 07/03/2020   PFIZER(Purple Top)SARS-COV-2 Vaccination 05/22/2019, 06/14/2019, 12/12/2019   PNEUMOCOCCAL CONJUGATE-20 11/24/2023   Pfizer Covid-19 Vaccine Bivalent Booster 41yrs & up 01/08/2021   Pfizer(Comirnaty)Fall Seasonal Vaccine 12 years and older 12/02/2022   Pneumococcal Conjugate-13 06/28/2017   Pneumococcal Polysaccharide-23 09/12/2018   Tdap 12/24/2017   Zoster Recombinant(Shingrix) 01/10/2020, 04/26/2020    Health Maintenance  Topic Date Due   HIV Screening  Never done  Colonoscopy  05/14/2027   DTaP/Tdap/Td (2 - Td or Tdap) 12/25/2027   Pneumococcal Vaccine: 50+ Years  Completed   INFLUENZA VACCINE  Completed   COVID-19 Vaccine  Completed   Hepatitis C Screening  Completed   Zoster Vaccines- Shingrix  Completed   Hepatitis B Vaccines 19-59 Average Risk  Aged  Out   HPV VACCINES  Aged Out   Meningococcal B Vaccine  Aged Out    Patient Care Team: Joyce Norleen BROCKS, MD as PCP - General (Family Medicine)   Outpatient Medications Prior to Visit  Medication Sig   albuterol  (VENTOLIN  HFA) 108 (90 Base) MCG/ACT inhaler Inhale 2 puffs into the lungs every 4 (four) hours as needed for wheezing or shortness of breath.   Albuterol -Budesonide  (AIRSUPRA ) 90-80 MCG/ACT AERO Inhale 2 puffs into the lungs every 6 (six) hours as needed.   ALPRAZolam  (XANAX ) 0.25 MG tablet Take 1 tablet (0.25 mg total) by mouth 2 (two) times daily as needed for anxiety.   amLODipine  (NORVASC ) 5 MG tablet Take 1 tablet (5 mg total) by mouth daily.   fluticasone -salmeterol (ADVAIR) 100-50 MCG/ACT AEPB Inhale 1 puff into the lungs 2 (two) times daily.   losartan  (COZAAR ) 25 MG tablet TAKE 1 TABLET (25 MG TOTAL) BY MOUTH DAILY.   Multiple Vitamin (MULTI-VITAMINS) TABS Take 1 tablet by mouth daily.   [DISCONTINUED] Testosterone  30 MG/ACT SOLN Apply 6 Applications topically daily.   [DISCONTINUED] levothyroxine  (SYNTHROID ) 88 MCG tablet TAKE 1 TABLET BY MOUTH EVERY DAY   [DISCONTINUED] metoprolol  succinate (TOPROL -XL) 50 MG 24 hr tablet TAKE 1 TABLET BY MOUTH DAILY WITH OR IMMEDIATELY FOLLOWING A MEAL. (Patient not taking: Reported on 11/24/2023)   [DISCONTINUED] rosuvastatin  (CRESTOR ) 40 MG tablet TAKE 1 TABLET BY MOUTH EVERY DAY   No facility-administered medications prior to visit.    ROS  Family and social history as well as health maintenance and immunizations was reviewed.     Objective:    BP 124/72   Pulse 87   Ht 6' 3 (1.905 m)   Wt 222 lb 6.4 oz (100.9 kg)   SpO2 97%   BMI 27.80 kg/m    Physical Exam   Alert and in no distress. Tympanic membranes and canals are normal. Pharyngeal area is normal. Neck is supple without adenopathy or thyromegaly. Cardiac exam shows a regular sinus rhythm without murmurs or gallops. Lungs are clear to auscultation.       Assessment & Plan:       Aortic valve stenosis and mitral valve prolapse with trifascicular block Monitored by cardiology. Potential future valve replacement discussed. TAVR mentioned as an option. - Follow up with cardiology for monitoring. He stopped taking his metoprolol  and I recommend that he start taking it again and did prescribe it.  Recommend he discuss possibly medication readjustment with cardiology. Carotid artery disease Monitored. - Continue monitoring with cardiology.  Asthma Well-controlled with current medication. No recent albuterol  use. - Continue Advair twice daily. - Monitor for increased albuterol  use.  Obstructive sleep apnea Managed with CPAP. Improved rest and reduced fatigue noted. - Continue using CPAP regularly.  Hypothyroidism Managed with medication. - Continue current thyroid  medication.  Mixed hyperlipidemia Managed with Crestor  10 mg. - Continue Crestor  10 mg daily.  Testicular hypofunction Managed with testosterone  therapy. Improved energy, strength, stamina, and libido reported. - Continue testosterone  therapy.  Allergic rhinitis due to pollen Managed with Allegra D. Recommended switch to Flonase  or Rhinocort to avoid decongestant effects on heart. - Use Flonase  or Rhinocort  regularly.   History of Hodgkin lymphoma No current need for follow-up imaging. Past x-rays sufficient.  History of benign neoplasm of colon (removed polyp) Recent colonoscopy showed no new polyps. - Next colonoscopy in five years.         Recheck testosterone  in 6 months but recheck Norleen Jobs, MD

## 2023-11-25 ENCOUNTER — Encounter: Payer: Self-pay | Admitting: Family Medicine

## 2023-11-25 DIAGNOSIS — F4389 Other reactions to severe stress: Secondary | ICD-10-CM | POA: Diagnosis not present

## 2023-11-25 LAB — CBC WITH DIFFERENTIAL/PLATELET
Basophils Absolute: 0.1 x10E3/uL (ref 0.0–0.2)
Basos: 1 %
EOS (ABSOLUTE): 0.2 x10E3/uL (ref 0.0–0.4)
Eos: 3 %
Hematocrit: 46.7 % (ref 37.5–51.0)
Hemoglobin: 14.1 g/dL (ref 13.0–17.7)
Immature Grans (Abs): 0 x10E3/uL (ref 0.0–0.1)
Immature Granulocytes: 0 %
Lymphocytes Absolute: 1.2 x10E3/uL (ref 0.7–3.1)
Lymphs: 16 %
MCH: 23.9 pg — ABNORMAL LOW (ref 26.6–33.0)
MCHC: 30.2 g/dL — ABNORMAL LOW (ref 31.5–35.7)
MCV: 79 fL (ref 79–97)
Monocytes Absolute: 0.6 x10E3/uL (ref 0.1–0.9)
Monocytes: 8 %
Neutrophils Absolute: 5.4 x10E3/uL (ref 1.4–7.0)
Neutrophils: 72 %
Platelets: 204 x10E3/uL (ref 150–450)
RBC: 5.89 x10E6/uL — ABNORMAL HIGH (ref 4.14–5.80)
RDW: 16.4 % — ABNORMAL HIGH (ref 11.6–15.4)
WBC: 7.5 x10E3/uL (ref 3.4–10.8)

## 2023-11-25 LAB — LIPID PANEL
Cholesterol, Total: 107 mg/dL (ref 100–199)
HDL: 59 mg/dL (ref >39)
LDL CALC COMMENT:: 1.8 ratio (ref 0.0–5.0)
LDL Chol Calc (NIH): 34 mg/dL (ref 0–99)
Triglycerides: 65 mg/dL (ref 0–149)
VLDL Cholesterol Cal: 14 mg/dL (ref 5–40)

## 2023-11-25 LAB — COMPREHENSIVE METABOLIC PANEL WITH GFR
ALT: 44 IU/L (ref 0–44)
AST: 47 IU/L — AB (ref 0–40)
Albumin: 4.4 g/dL (ref 3.9–4.9)
Alkaline Phosphatase: 72 IU/L (ref 44–121)
BUN/Creatinine Ratio: 13 (ref 10–24)
BUN: 14 mg/dL (ref 8–27)
Bilirubin Total: 0.4 mg/dL (ref 0.0–1.2)
CO2: 19 mmol/L — AB (ref 20–29)
Calcium: 9.2 mg/dL (ref 8.6–10.2)
Chloride: 102 mmol/L (ref 96–106)
Creatinine, Ser: 1.06 mg/dL (ref 0.76–1.27)
Globulin, Total: 2.4 g/dL (ref 1.5–4.5)
Glucose: 97 mg/dL (ref 70–99)
Potassium: 4.5 mmol/L (ref 3.5–5.2)
Sodium: 139 mmol/L (ref 134–144)
Total Protein: 6.8 g/dL (ref 6.0–8.5)
eGFR: 78 mL/min/1.73 (ref >59)

## 2023-11-25 LAB — TSH: TSH: 1.43 u[IU]/mL (ref 0.450–4.500)

## 2023-11-25 LAB — TESTOSTERONE: Testosterone: 120 ng/dL — AB (ref 264–916)

## 2023-11-26 ENCOUNTER — Ambulatory Visit: Payer: Self-pay | Admitting: Family Medicine

## 2023-12-15 DIAGNOSIS — F4389 Other reactions to severe stress: Secondary | ICD-10-CM | POA: Diagnosis not present

## 2023-12-17 LAB — SPECIMEN STATUS REPORT

## 2023-12-17 LAB — PSA: Prostate Specific Ag, Serum: 0.6 ng/mL (ref 0.0–4.0)

## 2023-12-29 DIAGNOSIS — F331 Major depressive disorder, recurrent, moderate: Secondary | ICD-10-CM | POA: Diagnosis not present

## 2024-01-04 ENCOUNTER — Telehealth: Payer: Self-pay

## 2024-01-04 DIAGNOSIS — E291 Testicular hypofunction: Secondary | ICD-10-CM

## 2024-01-04 NOTE — Addendum Note (Signed)
 Addended by: JOYCE NORLEEN BROCKS on: 01/04/2024 02:43 PM   Modules accepted: Orders

## 2024-01-04 NOTE — Telephone Encounter (Signed)
 Copied from CRM #[id number]. Topic: Clinical - Request for Lab/Test Order >> Jan 04, 2024 12:36 PM Tinnie BROCKS wrote: Reason for CRM: Pt calling to schedule 6 mo testosterone  lab recheck as recommended by Dr. Joyce on 9/3 appt. Lab orders not yet in chart.   Called Pt and scheduled 6 month testosterone  recheck for March 3rd 2026.

## 2024-01-05 ENCOUNTER — Encounter: Admitting: Family Medicine

## 2024-01-12 DIAGNOSIS — F331 Major depressive disorder, recurrent, moderate: Secondary | ICD-10-CM | POA: Diagnosis not present

## 2024-01-26 DIAGNOSIS — F331 Major depressive disorder, recurrent, moderate: Secondary | ICD-10-CM | POA: Diagnosis not present

## 2024-02-02 ENCOUNTER — Other Ambulatory Visit: Payer: Self-pay | Admitting: Family Medicine

## 2024-02-02 DIAGNOSIS — J45909 Unspecified asthma, uncomplicated: Secondary | ICD-10-CM

## 2024-02-09 DIAGNOSIS — F331 Major depressive disorder, recurrent, moderate: Secondary | ICD-10-CM | POA: Diagnosis not present

## 2024-02-23 DIAGNOSIS — F331 Major depressive disorder, recurrent, moderate: Secondary | ICD-10-CM | POA: Diagnosis not present

## 2024-03-11 ENCOUNTER — Other Ambulatory Visit: Payer: Self-pay | Admitting: Family Medicine

## 2024-03-11 DIAGNOSIS — E782 Mixed hyperlipidemia: Secondary | ICD-10-CM

## 2024-04-04 ENCOUNTER — Other Ambulatory Visit: Payer: Self-pay | Admitting: Family Medicine

## 2024-04-04 DIAGNOSIS — E291 Testicular hypofunction: Secondary | ICD-10-CM

## 2024-04-05 ENCOUNTER — Other Ambulatory Visit: Payer: Self-pay

## 2024-04-05 DIAGNOSIS — R7989 Other specified abnormal findings of blood chemistry: Secondary | ICD-10-CM

## 2024-04-05 NOTE — Telephone Encounter (Signed)
 Called patient to schedule labs, LVM. Ordered labs

## 2024-04-05 NOTE — Telephone Encounter (Signed)
 Patient called back to schedule labs

## 2024-04-07 ENCOUNTER — Other Ambulatory Visit: Payer: Self-pay

## 2024-04-07 LAB — TESTOSTERONE: Testosterone: 275 ng/dL (ref 264–916)

## 2024-04-09 ENCOUNTER — Ambulatory Visit: Payer: Self-pay | Admitting: Family Medicine

## 2024-04-11 ENCOUNTER — Other Ambulatory Visit: Payer: Self-pay

## 2024-04-11 DIAGNOSIS — R7989 Other specified abnormal findings of blood chemistry: Secondary | ICD-10-CM

## 2024-04-11 DIAGNOSIS — E291 Testicular hypofunction: Secondary | ICD-10-CM

## 2024-04-11 MED ORDER — TESTOSTERONE 50 MG/5GM (1%) TD GEL: 5.0000 g | Freq: Every day | TRANSDERMAL | 0 refills | Status: AC

## 2024-04-11 MED ORDER — TESTOSTERONE 50 MG/5GM (1%) TD GEL: 5.0000 g | Freq: Every day | TRANSDERMAL | Status: DC

## 2024-04-18 ENCOUNTER — Encounter: Payer: Self-pay | Admitting: Family Medicine

## 2024-04-18 ENCOUNTER — Ambulatory Visit: Payer: Self-pay | Admitting: Family Medicine

## 2024-04-18 VITALS — BP 134/74 | HR 88 | Ht 75.0 in | Wt 225.4 lb

## 2024-04-18 DIAGNOSIS — R0989 Other specified symptoms and signs involving the circulatory and respiratory systems: Secondary | ICD-10-CM

## 2024-04-18 DIAGNOSIS — Z8571 Personal history of Hodgkin lymphoma: Secondary | ICD-10-CM

## 2024-04-18 DIAGNOSIS — G4733 Obstructive sleep apnea (adult) (pediatric): Secondary | ICD-10-CM | POA: Diagnosis not present

## 2024-04-18 DIAGNOSIS — E039 Hypothyroidism, unspecified: Secondary | ICD-10-CM | POA: Diagnosis not present

## 2024-04-18 DIAGNOSIS — E782 Mixed hyperlipidemia: Secondary | ICD-10-CM

## 2024-04-18 DIAGNOSIS — D126 Benign neoplasm of colon, unspecified: Secondary | ICD-10-CM | POA: Diagnosis not present

## 2024-04-18 DIAGNOSIS — Z0000 Encounter for general adult medical examination without abnormal findings: Secondary | ICD-10-CM

## 2024-04-18 DIAGNOSIS — E291 Testicular hypofunction: Secondary | ICD-10-CM

## 2024-04-18 DIAGNOSIS — I453 Trifascicular block: Secondary | ICD-10-CM | POA: Diagnosis not present

## 2024-04-18 DIAGNOSIS — J301 Allergic rhinitis due to pollen: Secondary | ICD-10-CM | POA: Diagnosis not present

## 2024-04-18 DIAGNOSIS — I341 Nonrheumatic mitral (valve) prolapse: Secondary | ICD-10-CM

## 2024-04-18 DIAGNOSIS — I350 Nonrheumatic aortic (valve) stenosis: Secondary | ICD-10-CM

## 2024-04-18 LAB — POCT URINALYSIS DIP (PROADVANTAGE DEVICE)
Bilirubin, UA: NEGATIVE
Blood, UA: NEGATIVE
Glucose, UA: NEGATIVE mg/dL
Leukocytes, UA: NEGATIVE
Nitrite, UA: NEGATIVE
Specific Gravity, Urine: 1.02
Urobilinogen, Ur: 0.2
pH, UA: 6

## 2024-04-18 LAB — TESTOSTERONE: Testosterone: 376 ng/dL (ref 264–916)

## 2024-04-18 NOTE — Progress Notes (Signed)
 "  Complete physical exam  Patient: John Clay   DOB: Aug 10, 1960   64 y.o. Male  MRN: [id number]  Subjective:      John Clay is a 64 y.o. male who presents today for a complete physical exam.  He reports consuming a general diet. Works out at home, cardio and weights 3 x a week for 20 min a day.  He generally feels well. He reports sleeping fairly well. Discussed the use of AI scribe software for clinical note transcription with the patient, who gave verbal consent to proceed. Discussed the use of AI scribe software for clinical note transcription with the patient, who gave verbal consent to proceed. He experiences persistent nasal congestion and has a history of allergies. He typically uses Allegra D for symptom relief but has not used Benadryl or other allergy medications recently.  He uses a CPAP machine for sleep apnea and notes improvement in symptoms. However, he wakes up at least twice a night to urinate. No daytime urinary symptoms such as hesitancy or decreased stream. He recently had his testosterone  dosing increased.  He can tell a difference in his energy. He has a history of lymphoma, which is no longer actively followed. He has concerns about past radiation treatment potentially affecting his heart and valve situation. He has mitral valve prolapse and aortic stenosis and is followed by cardiology for these conditions. He continues on his thyroid  medication. He has a history of a precancerous colon condition, specifically tubular adenoma. A colonoscopy in 2024 revealed non-cancerous polyps, and he is on a five-year follow-up schedule for colonoscopy. Does have a history of left carotid bruit however echo showed that it was of no concern. His PSA was last checked in September and was 0.6.            Most recent fall risk assessment:    04/18/2024   10:22 AM  Fall Risk   Falls in the past year? 0  Number falls in past yr: 0  Injury with Fall? 0  Follow up  Falls evaluation completed     Most recent depression screenings:    04/18/2024   10:26 AM 04/18/2024   10:22 AM  PHQ 2/9 Scores  PHQ - 2 Score 1 1  PHQ- 9 Score 4     Vision:Within last year Dentist: yearly  Immunization History  Administered Date(s) Administered    sv, Bivalent, Protein Subunit Rsvpref,pf (Abrysvo) 12/24/2023   Influenza Inj Mdck Quad Pf 12/23/2016   Influenza Split 01/06/2012, 12/20/2012   Influenza, Quadrivalent, Recombinant, Inj, Pf 12/07/2017   Influenza, Seasonal, Injecte, Preservative Fre 12/02/2022, 11/24/2023   Influenza,inj,Quad PF,6+ Mos 12/12/2019, 01/08/2021, 12/03/2021   Influenza-Unspecified 11/30/2016, 12/23/2016, 12/07/2017, 12/05/2018   Moderna Covid-19 Fall Seasonal Vaccine 33yrs & older 12/24/2023   PFIZER Comirnaty(Gray Top)Covid-19 Tri-Sucrose Vaccine 07/03/2020   PFIZER(Purple Top)SARS-COV-2 Vaccination 05/22/2019, 06/14/2019, 12/12/2019   PNEUMOCOCCAL CONJUGATE-20 11/24/2023   Pfizer Covid-19 Vaccine Bivalent Booster 76yrs & up 01/08/2021   Pfizer(Comirnaty)Fall Seasonal Vaccine 12 years and older 12/02/2022   Pneumococcal Conjugate-13 06/28/2017   Pneumococcal Polysaccharide-23 09/12/2018   Tdap 12/24/2017   Zoster Recombinant(Shingrix) 01/10/2020, 04/26/2020    Health Maintenance  Topic Date Due   HIV Screening  Never done   Colonoscopy  05/14/2027   DTaP/Tdap/Td (2 - Td or Tdap) 12/25/2027   Pneumococcal Vaccine: 50+ Years  Completed   Influenza Vaccine  Completed   HPV VACCINES (No Doses Required) Completed   COVID-19 Vaccine  Completed   Hepatitis C  Screening  Completed   Zoster Vaccines- Shingrix  Completed   Hepatitis B Vaccines 19-59 Average Risk  Aged Out   Meningococcal B Vaccine  Aged Out    Patient Care Team: John Norleen BROCKS, John Clay as PCP - General (Family Medicine)  GI: Dr Dianna Dentist: Dr. Rosalind Optho: Dr. Darice Molt Card: Dr. Santo Beers: Gram[place]  Show/hide medication list[1]  Review of  Systems  All other systems reviewed and are negative.   Family and social history as well as health maintenance and immunizations was reviewed.     Objective:    BP 134/74   Pulse 88   Ht 6' 3 (1.905 m)   Wt 225 lb 6.4 oz (102.2 kg)   BMI 28.17 kg/m    Physical Exam   Alert and in no distress. Tympanic membranes and canals are normal. Pharyngeal area is normal. Neck is supple without adenopathy or thyromegaly. Cardiac exam shows a regular sinus rhythm with a grade 2 systolic murmur no gallops. Lungs are clear to auscultation.      Assessment & Plan:    Discussed health benefits of physical activity, and encouraged him to engage in regular exercise appropriate for his age and condition.  Routine general medical examination at a health care facility   History of Hodgkin's lymphoma  MVP (mitral valve prolapse)  Acquired hypothyroidism  Trifascicular block  Bruit of left carotid artery      Seasonal allergic rhinitis due to pollen Persistent nasal congestion likely due to seasonal allergic rhinitis. Discussed potential for tachyphylaxis with Allegra D and advised against decongestants due to blood pressure concerns. - Switch to regular Allegra if nasal congestion is not severe. - Consider switching to Claritin or Zyrtec if Allegra becomes ineffective.  Obstructive sleep apnea Managed with CPAP. Reports nocturia without daytime urinary symptoms. Advised on fluid management to address nocturia. - Reduce evening fluid intake. - Ensure bladder is empty before bed.  Nonrheumatic aortic valve stenosis Aortic valve stenosis with murmur. Under cardiology care with periodic echocardiograms. Discussed potential future interventions if stenosis progresses. - Continue monitoring with cardiology. - Report any new symptoms such as increased shortness of breath or weakness.  Nonrheumatic mitral valve prolapse Mitral valve prolapse with no current symptoms or changes in  management. - Continue monitoring with cardiology.  Hypogonadism in male Managed with testosterone  therapy. Recent dosage increase with noticeable symptom improvement. - Checked testosterone  levels today.  Acquired hypothyroidism Managed with thyroid  medication. - Continue current thyroid  medication.  Mixed hyperlipidemia Managed with medication. - Continue current lipid-lowering therapy.  Screening for colorectal neoplasia History of tubular adenoma with last colonoscopy in 2024 showing non-cancerous polyps. - Continue with scheduled colonoscopy in 2029.  General Health Maintenance Immunizations up to date, including RSV vaccine. Discussed PSA levels and decision to not pursue further testing due to low risk and age-related considerations. - Continue routine health maintenance. - No further PSA testing at this time.           Norleen Joyce, John Clay     [1]  Outpatient Medications Prior to Visit  Medication Sig Note   albuterol  (VENTOLIN  HFA) 108 (90 Base) MCG/ACT inhaler Inhale 2 puffs into the lungs every 4 (four) hours as needed for wheezing or shortness of breath. 04/18/2024: As needed, rarely (sometimes prior to exercise)   ALPRAZolam  (XANAX ) 0.25 MG tablet Take 1 tablet (0.25 mg total) by mouth 2 (two) times daily as needed for anxiety. 04/18/2024: infrequently   amLODipine  (NORVASC ) 5 MG tablet Take 1  tablet (5 mg total) by mouth daily.    levothyroxine  (SYNTHROID ) 88 MCG tablet Take 1 tablet (88 mcg total) by mouth daily.    losartan  (COZAAR ) 25 MG tablet TAKE 1 TABLET (25 MG TOTAL) BY MOUTH DAILY.    metoprolol  succinate (TOPROL -XL) 50 MG 24 hr tablet TAKE 1 TABLET BY MOUTH DAILY WITH OR IMMEDIATELY FOLLOWING A MEAL.    rosuvastatin  (CRESTOR ) 40 MG tablet TAKE 1 TABLET BY MOUTH EVERY DAY    testosterone  (ANDROGEL ) 50 MG/5GM (1%) GEL Place 5 g onto the skin daily.    WIXELA INHUB 100-50 MCG/ACT AEPB INHALE 1 PUFF INTO THE LUNGS TWICE A DAY    Multiple Vitamin  (MULTI-VITAMINS) TABS Take 1 tablet by mouth daily.    [DISCONTINUED] Albuterol -Budesonide  (AIRSUPRA ) 90-80 MCG/ACT AERO Inhale 2 puffs into the lungs every 6 (six) hours as needed.    No facility-administered medications prior to visit.   "

## 2024-04-18 NOTE — Addendum Note (Signed)
 Addended by: VIVIAN COUNTRYMAN F on: 04/18/2024 11:44 AM   Modules accepted: Orders

## 2024-04-19 ENCOUNTER — Ambulatory Visit: Payer: Self-pay | Admitting: Family Medicine

## 2024-05-03 ENCOUNTER — Ambulatory Visit (HOSPITAL_COMMUNITY)

## 2024-05-23 ENCOUNTER — Other Ambulatory Visit: Payer: Self-pay

## 2025-04-19 ENCOUNTER — Encounter: Admitting: Family Medicine
